# Patient Record
Sex: Female | Born: 1995 | Race: White | Hispanic: No | Marital: Single | Smoking: Never smoker
Health system: Southern US, Community
[De-identification: ages and names within clinical notes are randomized; demographics above are authoritative.]

## PROBLEM LIST (undated history)

## (undated) DIAGNOSIS — K219 Gastro-esophageal reflux disease without esophagitis: Secondary | ICD-10-CM

## (undated) DIAGNOSIS — E66811 Obesity, class 1: Secondary | ICD-10-CM

## (undated) DIAGNOSIS — T7840XA Allergy, unspecified, initial encounter: Secondary | ICD-10-CM

## (undated) DIAGNOSIS — E669 Obesity, unspecified: Secondary | ICD-10-CM

## (undated) HISTORY — DX: Obesity, class 1: E66.811

## (undated) HISTORY — DX: Obesity, unspecified: E66.9

## (undated) HISTORY — DX: Allergy, unspecified, initial encounter: T78.40XA

---
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
## 2014-09-07 ENCOUNTER — Encounter: Payer: Self-pay | Admitting: General Surgery

## 2014-09-07 ENCOUNTER — Ambulatory Visit (INDEPENDENT_AMBULATORY_CARE_PROVIDER_SITE_OTHER): Payer: PRIVATE HEALTH INSURANCE | Admitting: General Surgery

## 2014-09-07 VITALS — BP 100/70 | HR 64 | Resp 12 | Ht 64.0 in | Wt 165.0 lb

## 2014-09-07 DIAGNOSIS — K602 Anal fissure, unspecified: Secondary | ICD-10-CM | POA: Diagnosis not present

## 2014-09-07 NOTE — Progress Notes (Signed)
Patient ID: Katelyn SportsmanEmory S Hall, female   DOB: 11/26/1995, 18 y.o.   MRN: [id number]  Chief Complaint  Patient presents with  . Other    hemorrhoidal skin tag with anal bleeding    HPI Katelyn Hall is a 18 y.o. female who presents for an evaluation of a hemorrhoidal skin tag. She states she has some rectal bleeding with wiping. She noticed this area approximately 2 months ago. She states since she has noticed this area it has become uncomfortable.    HPI  Past Medical History  Diagnosis Date  . Allergy     History reviewed. No pertinent past surgical history.  History reviewed. No pertinent family history.  Social History History  Substance Use Topics  . Smoking status: Never Smoker   . Smokeless tobacco: Never Used  . Alcohol Use: Yes    Not on File  Current Outpatient Prescriptions  Medication Sig Dispense Refill  . cetirizine (ZYRTEC) 10 MG tablet Take 1 tablet by mouth daily.  11  . fluticasone (FLONASE) 50 MCG/ACT nasal spray Place 1 spray into both nostrils daily.    . SPRINTEC 28 0.25-35 MG-MCG tablet Take 1 tablet by mouth daily.  4   No current facility-administered medications for this visit.    Review of Systems Review of Systems  Constitutional: Negative.   Respiratory: Negative.   Cardiovascular: Negative.     Blood pressure 100/70, pulse 64, resp. rate 12, height 5\' 4"  (1.626 m), weight 165 lb (74.844 kg), last menstrual period 09/06/2014.  Physical Exam Physical Exam  Constitutional: She is oriented to person, place, and time. She appears well-developed and well-nourished.  Eyes: Conjunctivae are normal.  Neck: Neck supple. No thyromegaly present.  Cardiovascular: Normal rate, regular rhythm and normal heart sounds.   No murmur heard. Pulmonary/Chest: Effort normal and breath sounds normal.  Abdominal: Soft. Bowel sounds are normal. There is no tenderness. No hernia.  Genitourinary: Rectal exam shows fissure (5mm long posterior anal fissure with a  small sentinel tag).  Neurological: She is alert and oriented to person, place, and time.  Skin: Skin is warm.    Data Reviewed    Assessment    Posterior anal fissure     Plan    Pt is a [employer]ing [employer]. Best option is lateral internal sphincterotomy. Explained all options and risks and benefits. Pt is agreeable to surgical treatment..     Patient to call back to schedule surgery once she has insurance information for January. She has been given surgery paperwork and prospective surgery dates available.    [name] G 09/07/2014, 7:45 PM

## 2014-09-07 NOTE — Patient Instructions (Addendum)
The patient is aware to call back for any questions or concerns.  Patient to call back to schedule surgery once she has insurance information for January. She has been given surgery paperwork and prospective surgery dates available.

## 2016-09-17 DIAGNOSIS — Z8619 Personal history of other infectious and parasitic diseases: Secondary | ICD-10-CM

## 2016-09-17 HISTORY — DX: Personal history of other infectious and parasitic diseases: Z86.19

## 2017-06-06 ENCOUNTER — Encounter: Payer: Self-pay | Admitting: General Surgery

## 2017-06-06 ENCOUNTER — Ambulatory Visit (INDEPENDENT_AMBULATORY_CARE_PROVIDER_SITE_OTHER): Payer: 59 | Admitting: General Surgery

## 2017-06-06 VITALS — BP 120/82 | HR 101 | Resp 12 | Ht 62.0 in | Wt 191.0 lb

## 2017-06-06 DIAGNOSIS — K602 Anal fissure, unspecified: Secondary | ICD-10-CM

## 2017-06-06 NOTE — Progress Notes (Signed)
Patient ID: Katelyn Hall, female   DOB: 04/03/1996, 21 y.o.   MRN: [id number]  Chief Complaint  Patient presents with  . Rectal Bleeding    HPI Katelyn Hall is a 21 y.o. female here today for rectal bleeding, worsening for the past 2 weeks. Reports bright red mucous bleeding with bowel movements and urination when she wipes. She has irregular bowel movements and goes every 1-3 days but last night had 3 BMs. She has abdominal pain that is relieved with bowel movements and reports sudden urgency to go.  Patient had episodes of rectal bleeding in 2017 and was seen here and diagnosed with an anal fissure, but did not return to clinic for follow up. Denies shortness of breath, lightheadedness, nausea, vomiting. Negative family history for autoimmune conditions. Father was diagnosed with colon cancer at age 61, doing well now. Paternal uncles have history of colon polyps. Mother is present with patient in clinic.   HPI  Past Medical History:  Diagnosis Date  . Allergy     No past surgical history on file.  Family History  Problem Relation Age of Onset  . Colon cancer Father 80  . Colon polyps Paternal Uncle     Social History Social History  Substance Use Topics  . Smoking status: Never Smoker  . Smokeless tobacco: Never Used  . Alcohol use Yes    No Known Allergies  Current Outpatient Prescriptions  Medication Sig Dispense Refill  . cetirizine (ZYRTEC) 10 MG tablet Take 1 tablet by mouth daily.  11  . fluticasone (FLONASE) 50 MCG/ACT nasal spray Place 1 spray into both nostrils daily.    . SPRINTEC 28 0.25-35 MG-MCG tablet Take 1 tablet by mouth daily.  4   No current facility-administered medications for this visit.     Review of Systems Review of Systems  Constitutional: Negative.   Respiratory: Negative.   Cardiovascular: Negative.   Gastrointestinal: Positive for abdominal pain and blood in stool. Negative for diarrhea, nausea and vomiting.    Blood pressure  120/82, pulse (!) 101, resp. rate 12, height  (1.575 m), weight 191 lb (86.6 kg), last menstrual period 05/06/2017.  Physical Exam Physical Exam  Constitutional: She is oriented to person, place, and time. She appears well-developed and well-nourished.  Cardiovascular: Normal rate and regular rhythm.   Pulmonary/Chest: Effort normal and breath sounds normal.  Abdominal: Soft. Bowel sounds are normal. She exhibits no mass. There is no tenderness.  Genitourinary:     Neurological: She is alert and oriented to person, place, and time.  Skin: Skin is warm and dry.  Psychiatric: She has a normal mood and affect. Her behavior is normal.    Data Reviewed Prior notes reviewed   Assessment    Anal fissure - patient had a documented anal fissure in 2017 and did not return to the clinic for the procedure. Physical exam today shows anal fissure is still present. Her rectal symptoms are likely from this but full exam of anorectal area can be accomplished at time of surgery    Plan   Discussed anal sphincterotomy/fissurectomy as before and at the same time, do a sigmoidoscopy to ensure no other source for bleeding.    HPI, Physical Exam, Assessment and Plan have been scribed under the direction and in the presence of Kathreen Cosier, MD  Ples Specter, CMA  I have completed the exam and reviewed the above documentation for accuracy and completeness.  I agree with the above.  Nurse, children's  Technology has been used and any errors in dictation or transcription are unintentional.  Hashir Deleeuw G. Irvan Tiedt, M.D., F.A.C.S.   Cariana Karge G 06/06/2017, 2:26 PM  Patient's surgery has been scheduled for 06-13-17 at [facility].   Michele J. Bailey, CMA    

## 2017-06-06 NOTE — Patient Instructions (Signed)
Discussed anal sphincterotomy/fissurectomy as before and at the same time, do a sigmoidoscopy to ensure no other source for bleeding.

## 2017-06-10 ENCOUNTER — Encounter
Admission: RE | Admit: 2017-06-10 | Discharge: 2017-06-10 | Disposition: A | Discharge status: Home / Self Care | Payer: 59 | Source: Ambulatory Visit | Attending: General Surgery | Admitting: General Surgery

## 2017-06-10 HISTORY — DX: Gastro-esophageal reflux disease without esophagitis: K21.9

## 2017-06-10 NOTE — Patient Instructions (Signed)
Your procedure is scheduled on: 06-13-17 Report to Same Day Surgery 2nd floor medical mall [facility] Entrance-take elevator on left to 2nd floor.  Check in with surgery information desk.) To find out your arrival time please call [phone] between 1PM - 3PM on 06-12-17  Remember: Instructions that are not followed completely may result in serious medical risk, up to and including death, or upon the discretion of your surgeon and anesthesiologist your surgery may need to be rescheduled.    _x___ 1. Do not eat food after midnight the night before your procedure. You may drink clear liquids up to 2 hours before you are scheduled to arrive at the hospital for your procedure.  Do not drink clear liquids within 2 hours of your scheduled arrival to the hospital.  Clear liquids include  --Water or Apple juice without pulp  --Clear carbohydrate beverage such as ClearFast or Gatorade  --Black Coffee or Clear Tea (No milk, no creamers, do not add anything to the coffee or Tea Type 1 and type 2 diabetics should only drink water.  No gum chewing or hard candies.     __x__ 2. No Alcohol for 24 hours before or after surgery.   __x__3. No Smoking for 24 prior to surgery.   ____  4. Bring all medications with you on the day of surgery if instructed.    __x__ 5. Notify your doctor if there is any change in your medical condition     (cold, fever, infections).     Do not wear jewelry, make-up, hairpins, clips or nail polish.  Do not wear lotions, powders, or perfumes. You may wear deodorant.  Do not shave 48 hours prior to surgery. Men may shave face and neck.  Do not bring valuables to the hospital.    [facility] is not responsible for any belongings or valuables.               Contacts, dentures or bridgework may not be worn into surgery.  Leave your suitcase in the car. After surgery it may be brought to your room.  For patients admitted to the hospital, discharge time is determined by your                        treatment team.   Patients discharged the day of surgery will not be allowed to drive home.  You will need someone to drive you home and stay with you the night of your procedure.    Please read over the following fact sheets that you were given:     ____ Take anti-hypertensive listed below, cardiac, seizure, asthma,     anti-reflux and psychiatric medicines. These include:  1. NONE  2.  3.  4.  5.  6.  _X___Fleets enema or Magnesium Citrate as directed-DO FLEET ENEMA AT HOME Thursday MORNING ONE HOUR PRIOR TO ARRIVAL TIME TO HOSPITAL   ____ Use CHG Soap or sage wipes as directed on instruction sheet   ____ Use inhalers on the day of surgery and bring to hospital day of surgery  ____ Stop Metformin and Janumet 2 days prior to surgery.    ____ Take 1/2 of usual insulin dose the night before surgery and none on the morning surgery.   ____ Follow recommendations from Cardiologist, Pulmonologist or PCP regarding stopping Aspirin, Coumadin, Plavix ,Eliquis, Effient, or Pradaxa, and Pletal.  ____Stop Anti-inflammatories such as Advil, Aleve, Ibuprofen, Motrin, Naproxen, Naprosyn, Goodies powders or aspirin products.  OK to take Tylenol    ____ Stop supplements until after surgery.    ____ Bring C-Pap to the hospital.

## 2017-06-13 ENCOUNTER — Encounter
Admission: RE | Disposition: A | Discharge status: Home / Self Care | Payer: Self-pay | Source: Ambulatory Visit | Attending: General Surgery

## 2017-06-13 ENCOUNTER — Ambulatory Visit
Admission: RE | Admit: 2017-06-13 | Discharge: 2017-06-13 | Disposition: A | Discharge status: Home / Self Care | Payer: 59 | Source: Ambulatory Visit | Attending: General Surgery | Admitting: General Surgery

## 2017-06-13 ENCOUNTER — Ambulatory Visit: Payer: 59 | Admitting: Registered Nurse

## 2017-06-13 DIAGNOSIS — K219 Gastro-esophageal reflux disease without esophagitis: Secondary | ICD-10-CM | POA: Diagnosis not present

## 2017-06-13 DIAGNOSIS — Z8371 Family history of colonic polyps: Secondary | ICD-10-CM | POA: Diagnosis not present

## 2017-06-13 DIAGNOSIS — K602 Anal fissure, unspecified: Secondary | ICD-10-CM | POA: Diagnosis not present

## 2017-06-13 DIAGNOSIS — K625 Hemorrhage of anus and rectum: Secondary | ICD-10-CM | POA: Diagnosis not present

## 2017-06-13 DIAGNOSIS — K644 Residual hemorrhoidal skin tags: Secondary | ICD-10-CM | POA: Insufficient documentation

## 2017-06-13 DIAGNOSIS — Z800 Family history of malignant neoplasm of digestive organs: Secondary | ICD-10-CM | POA: Insufficient documentation

## 2017-06-13 DIAGNOSIS — Z79899 Other long term (current) drug therapy: Secondary | ICD-10-CM | POA: Diagnosis not present

## 2017-06-13 HISTORY — PX: SIGMOIDOSCOPY: SHX6686

## 2017-06-13 HISTORY — PX: SPHINCTEROTOMY: SHX5279

## 2017-06-13 LAB — POCT PREGNANCY, URINE: Preg Test, Ur: NEGATIVE

## 2017-06-13 SURGERY — SPHINCTEROTOMY, ANAL
Anesthesia: General | Wound class: Clean Contaminated

## 2017-06-13 MED ORDER — PROPOFOL 10 MG/ML IV BOLUS
INTRAVENOUS | Status: AC
  Filled 2017-06-13: qty 20

## 2017-06-13 MED ORDER — FENTANYL CITRATE (PF) 100 MCG/2ML IJ SOLN: 25.0000 ug | INTRAMUSCULAR | Status: DC | PRN

## 2017-06-13 MED ORDER — PROMETHAZINE HCL 25 MG/ML IJ SOLN: 6.2500 mg | INTRAMUSCULAR | Status: DC | PRN

## 2017-06-13 MED ORDER — MIDAZOLAM HCL 2 MG/2ML IJ SOLN
INTRAMUSCULAR | Status: AC
  Filled 2017-06-13: qty 2

## 2017-06-13 MED ORDER — DEXAMETHASONE SODIUM PHOSPHATE 10 MG/ML IJ SOLN
INTRAMUSCULAR | Status: DC | PRN
  Administered 2017-06-13: 5 mg via INTRAVENOUS

## 2017-06-13 MED ORDER — LIDOCAINE HCL (PF) 2 % IJ SOLN
INTRAMUSCULAR | Status: AC
  Filled 2017-06-13: qty 2

## 2017-06-13 MED ORDER — SEVOFLURANE IN SOLN
RESPIRATORY_TRACT | Status: AC
  Filled 2017-06-13: qty 250

## 2017-06-13 MED ORDER — LIDOCAINE HCL 2 % EX GEL
CUTANEOUS | Status: AC
  Filled 2017-06-13: qty 5

## 2017-06-13 MED ORDER — TRAMADOL HCL 50 MG PO TABS: 50.0000 mg | ORAL_TABLET | Freq: Four times a day (QID) | ORAL | 0 refills | Status: DC | PRN

## 2017-06-13 MED ORDER — BUPIVACAINE HCL (PF) 0.5 % IJ SOLN
INTRAMUSCULAR | Status: DC | PRN
  Administered 2017-06-13: 11 mL

## 2017-06-13 MED ORDER — BACIT-POLY-NEO HC 1 % EX OINT
TOPICAL_OINTMENT | CUTANEOUS | Status: DC | PRN
  Administered 2017-06-13: 1

## 2017-06-13 MED ORDER — DEXAMETHASONE SODIUM PHOSPHATE 10 MG/ML IJ SOLN
INTRAMUSCULAR | Status: AC
  Filled 2017-06-13: qty 1

## 2017-06-13 MED ORDER — LACTATED RINGERS IV SOLN
INTRAVENOUS | Status: DC
  Administered 2017-06-13: 09:00:00 via INTRAVENOUS

## 2017-06-13 MED ORDER — LIDOCAINE HCL (PF) 1 % IJ SOLN
INTRAMUSCULAR | Status: DC | PRN
  Administered 2017-06-13: 11 mL

## 2017-06-13 MED ORDER — BACITRACIN ZINC 500 UNIT/GM EX OINT
TOPICAL_OINTMENT | CUTANEOUS | Status: AC
  Filled 2017-06-13: qty 28.35

## 2017-06-13 MED ORDER — FLEET ENEMA 7-19 GM/118ML RE ENEM: 1.0000 | ENEMA | Freq: Once | RECTAL | Status: DC

## 2017-06-13 MED ORDER — LIDOCAINE HCL (PF) 1 % IJ SOLN
INTRAMUSCULAR | Status: AC
  Filled 2017-06-13: qty 2

## 2017-06-13 MED ORDER — MIDAZOLAM HCL 2 MG/2ML IJ SOLN
INTRAMUSCULAR | Status: DC | PRN
  Administered 2017-06-13: 2 mg via INTRAVENOUS

## 2017-06-13 MED ORDER — FAMOTIDINE 20 MG PO TABS
ORAL_TABLET | ORAL | Status: AC
  Administered 2017-06-13: 20 mg via ORAL
  Filled 2017-06-13: qty 1

## 2017-06-13 MED ORDER — PROPOFOL 10 MG/ML IV BOLUS
INTRAVENOUS | Status: DC | PRN
  Administered 2017-06-13: 200 mg via INTRAVENOUS

## 2017-06-13 MED ORDER — OXYCODONE HCL 5 MG PO TABS
5.0000 mg | ORAL_TABLET | Freq: Once | ORAL | Status: AC | PRN
  Administered 2017-06-13: 5 mg via ORAL

## 2017-06-13 MED ORDER — FAMOTIDINE 20 MG PO TABS
20.0000 mg | ORAL_TABLET | Freq: Once | ORAL | Status: AC
  Administered 2017-06-13: 20 mg via ORAL

## 2017-06-13 MED ORDER — ONDANSETRON HCL 4 MG/2ML IJ SOLN
INTRAMUSCULAR | Status: AC
  Filled 2017-06-13: qty 2

## 2017-06-13 MED ORDER — LIDOCAINE HCL (CARDIAC) 20 MG/ML IV SOLN
INTRAVENOUS | Status: DC | PRN
  Administered 2017-06-13: 60 mg via INTRAVENOUS

## 2017-06-13 MED ORDER — LIDOCAINE HCL (PF) 1 % IJ SOLN
INTRAMUSCULAR | Status: AC
  Filled 2017-06-13: qty 30

## 2017-06-13 MED ORDER — FENTANYL CITRATE (PF) 100 MCG/2ML IJ SOLN
INTRAMUSCULAR | Status: DC | PRN
  Administered 2017-06-13 (×4): 25 ug via INTRAVENOUS

## 2017-06-13 MED ORDER — OXYCODONE HCL 5 MG/5ML PO SOLN
5.0000 mg | Freq: Once | ORAL | Status: AC | PRN
Start: 2017-06-13 — End: 2017-06-13

## 2017-06-13 MED ORDER — OXYCODONE HCL 5 MG PO TABS
ORAL_TABLET | ORAL | Status: AC
  Filled 2017-06-13: qty 1

## 2017-06-13 MED ORDER — ONDANSETRON HCL 4 MG/2ML IJ SOLN
INTRAMUSCULAR | Status: DC | PRN
  Administered 2017-06-13: 4 mg via INTRAVENOUS

## 2017-06-13 MED ORDER — ACETAMINOPHEN 10 MG/ML IV SOLN
INTRAVENOUS | Status: AC
  Filled 2017-06-13: qty 100

## 2017-06-13 MED ORDER — FENTANYL CITRATE (PF) 100 MCG/2ML IJ SOLN
INTRAMUSCULAR | Status: AC
  Filled 2017-06-13: qty 2

## 2017-06-13 MED ORDER — BUPIVACAINE HCL (PF) 0.5 % IJ SOLN
INTRAMUSCULAR | Status: AC
  Filled 2017-06-13: qty 30

## 2017-06-13 SURGICAL SUPPLY — 24 items
BLADE SURG 11 STRL SS SAFETY (MISCELLANEOUS) ×3 IMPLANT
BLADE SURG 15 STRL SS SAFETY (BLADE) ×6 IMPLANT
BRIEF STRETCH MATERNITY 2XLG (MISCELLANEOUS) ×3 IMPLANT
CANISTER SUCT 1200ML W/VALVE (MISCELLANEOUS) ×3 IMPLANT
DRAPE LAPAROTOMY 77X122 PED (DRAPES) ×3 IMPLANT
DRAPE LEGGINS SURG 28X43 STRL (DRAPES) ×3 IMPLANT
DRAPE UNDER BUTTOCK W/FLU (DRAPES) ×3 IMPLANT
ELECT REM PT RETURN 9FT ADLT (ELECTROSURGICAL) ×3
ELECTRODE REM PT RTRN 9FT ADLT (ELECTROSURGICAL) ×1 IMPLANT
GLOVE BIO SURGEON STRL SZ7 (GLOVE) ×15 IMPLANT
GOWN STRL REUS W/ TWL LRG LVL3 (GOWN DISPOSABLE) ×3 IMPLANT
GOWN STRL REUS W/TWL LRG LVL3 (GOWN DISPOSABLE) ×6
KIT RM TURNOVER CYSTO AR (KITS) ×3 IMPLANT
LABEL OR SOLS (LABEL) ×3 IMPLANT
NEEDLE HYPO 25X1 1.5 SAFETY (NEEDLE) ×3 IMPLANT
PACK BASIN MINOR ARMC (MISCELLANEOUS) ×3 IMPLANT
PAD OB MATERNITY 4.3X12.25 (PERSONAL CARE ITEMS) ×3 IMPLANT
PAD PREP 24X41 OB/GYN DISP (PERSONAL CARE ITEMS) ×3 IMPLANT
SOL PREP PVP 2OZ (MISCELLANEOUS) ×3
SOLUTION PREP PVP 2OZ (MISCELLANEOUS) ×1 IMPLANT
SURGILUBE 2OZ TUBE FLIPTOP (MISCELLANEOUS) ×3 IMPLANT
SUT VIC AB 3-0 SH 27 (SUTURE) ×2
SUT VIC AB 3-0 SH 27X BRD (SUTURE) ×1 IMPLANT
SYR CONTROL 10ML (SYRINGE) ×3 IMPLANT

## 2017-06-13 NOTE — Interval H&P Note (Signed)
History and Physical Interval Note:  06/13/2017 9:44 AM  Katelyn Hall  has presented today for surgery, with the diagnosis of ANAL FISSURE,RECTAL BLEEDING  The various methods of treatment have been discussed with the patient and family. After consideration of risks, benefits and other options for treatment, the patient has consented to  Procedure(s): SPHINCTEROTOMY (N/A) FISSURECTOMY (N/A) SIGMOIDOSCOPY (N/A) as a surgical intervention .  The patient's history has been reviewed, patient examined, no change in status, stable for surgery.  I have reviewed the patient's chart and labs.  Questions were answered to the patient's satisfaction.     SANKAR,SEEPLAPUTHUR G

## 2017-06-13 NOTE — Anesthesia Postprocedure Evaluation (Signed)
Anesthesia Post Note  Patient: Yaileen S Cena  Procedure(s) Performed: Procedure(s) (LRB): LATERAL INTERNAL SPHINCTEROTOMY (N/A) SIGMOIDOSCOPY (N/A)  Patient location during evaluation: PACU Anesthesia Type: General Level of consciousness: awake and alert Pain management: pain level controlled Vital Signs Assessment: post-procedure vital signs reviewed and stable Respiratory status: spontaneous breathing, nonlabored ventilation, respiratory function stable and patient connected to nasal cannula oxygen Cardiovascular status: blood pressure returned to baseline and stable Postop Assessment: no apparent nausea or vomiting Anesthetic complications: no     Last Vitals:  Vitals:   06/13/17 1140 06/13/17 1147  BP: 124/83 111/69  Pulse: 67 68  Resp: (!) 21 16  Temp:  (!) 35.6 C  SpO2: 100% 100%    Last Pain:  Vitals:   06/13/17 1147  TempSrc: Temporal  PainSc: 8                  Jomarie Longs K Piscitello

## 2017-06-13 NOTE — Anesthesia Procedure Notes (Signed)
Procedure Name: LMA Insertion Date/Time: 06/13/2017 10:00 AM Performed by: Karoline Caldwell Pre-anesthesia Checklist: Patient identified, Patient being monitored, Timeout performed, Emergency Drugs available and Suction available Patient Re-evaluated:Patient Re-evaluated prior to induction Oxygen Delivery Method: Circle system utilized Preoxygenation: Pre-oxygenation with 100% oxygen Induction Type: IV induction Ventilation: Mask ventilation without difficulty LMA: LMA inserted LMA Size: 4.0 Tube type: Oral Number of attempts: 1 Placement Confirmation: positive ETCO2 and breath sounds checked- equal and bilateral Tube secured with: Tape Dental Injury: Teeth and Oropharynx as per pre-operative assessment

## 2017-06-13 NOTE — Anesthesia Preprocedure Evaluation (Signed)
Anesthesia Evaluation  Patient identified by MRN, date of birth, ID band Patient awake    Reviewed: Allergy & Precautions, H&P , NPO status , Patient's Chart, lab work & pertinent test results  History of Anesthesia Complications Negative for: history of anesthetic complications  Airway Mallampati: III  TM Distance: <3 FB Neck ROM: full    Dental  (+) Chipped   Pulmonary neg pulmonary ROS, neg shortness of breath,           Cardiovascular Exercise Tolerance: Good (-) angina(-) Past MI and (-) DOE negative cardio ROS       Neuro/Psych negative neurological ROS  negative psych ROS   GI/Hepatic Neg liver ROS, GERD  Controlled and Medicated,  Endo/Other  negative endocrine ROS  Renal/GU      Musculoskeletal   Abdominal   Peds  Hematology negative hematology ROS (+)   Anesthesia Other Findings Past Medical History: No date: Allergy No date: GERD (gastroesophageal reflux disease)     Comment:  OCC-TUMS PRN  History reviewed. No pertinent surgical history.  BMI    Body Mass Index:  34.93 kg/m      Reproductive/Obstetrics negative OB ROS                             Anesthesia Physical Anesthesia Plan  ASA: III  Anesthesia Plan: General LMA   Post-op Pain Management:    Induction: Intravenous  PONV Risk Score and Plan: 3 and Ondansetron, Dexamethasone, Midazolam and Treatment may vary due to age or medical condition  Airway Management Planned: LMA  Additional Equipment:   Intra-op Plan:   Post-operative Plan: Extubation in OR  Informed Consent: I have reviewed the patients History and Physical, chart, labs and discussed the procedure including the risks, benefits and alternatives for the proposed anesthesia with the patient or authorized representative who has indicated his/her understanding and acceptance.   Dental Advisory Given  Plan Discussed with: Anesthesiologist,  CRNA and Surgeon  Anesthesia Plan Comments: (Patient consented for risks of anesthesia including but not limited to:  - adverse reactions to medications - damage to teeth, lips or other oral mucosa - sore throat or hoarseness - Damage to heart, brain, lungs or loss of life  Patient voiced understanding.)        Anesthesia Quick Evaluation

## 2017-06-13 NOTE — Op Note (Signed)
Preop diagnosis: Anal fissure  Post op diagnosis: Same  Operation: Rigid sigmoidoscopy and lateral internal sphincterotomy  Surgeon: S PEG. Renold Kozar  Assistant:     Anesthesia: Gen.  Complications: None  EBL: Minimal  Drains: None  Description: Patient was put to sleep and then placed in the lithotomy position the operating table. Rectal area was prepped and draped sterile field and timeout performed. Rigid sigmoidoscope was inserted and advanced about the 20 cm level. The scope was gradually withdrawn inspecting the lumen with no abnormality noted. After the scope was removed the anal area was smaller adequately visualize showing that there was a well-defined chronic posterior anal fissure with a tiny 3 mm skin tag. A bivalve speculum was then used and lateral internal sphincterotomy completed at the 3 and 9:00 locations. Total of 20 mL of 0.5% Marcaine mixed with 1% Xylocaine was instilled at the side of sphincterotomy and the anal fissure. 2 small radial incisions were made at the 3 and 9:00 location. The edge of the internal sphincter was lifted up and cut with cautery. 2 openings were closed with buried stitches of 3-0 Vicryl. Anal fissure area was cauterized to get rid of any unhealthy tissue. The anorectal area was covered with bacitracin ointment. Patient subsequently taken down from lithotomy and returned subsequently to recovery room in stable condition.

## 2017-06-13 NOTE — Progress Notes (Signed)
States pain is somewhat better and wants to go home

## 2017-06-13 NOTE — Anesthesia Post-op Follow-up Note (Signed)
Anesthesia QCDR form completed.        

## 2017-06-13 NOTE — Transfer of Care (Signed)
Immediate Anesthesia Transfer of Care Note  Patient: Katelyn Hall  Procedure(s) Performed: Procedure(s): LATERAL INTERNAL SPHINCTEROTOMY (N/A) SIGMOIDOSCOPY (N/A)  Patient Location: PACU  Anesthesia Type:General  Level of Consciousness: awake, alert  and oriented  Airway & Oxygen Therapy: Patient Spontanous Breathing and Patient connected to face mask oxygen  Post-op Assessment: Report given to RN and Post -op Vital signs reviewed and stable  Post vital signs: Reviewed and stable  Last Vitals:  Vitals:   06/13/17 0828 06/13/17 1101  BP: 140/87   Pulse: 74 62  Resp: 17 18  Temp: (!) 35.9 C (!) 36.4 C  SpO2: 99% 100%    Last Pain:  Vitals:   06/13/17 1101  TempSrc: Temporal         Complications: No apparent anesthesia complications

## 2017-06-13 NOTE — H&P (View-Only) (Signed)
Patient ID: Katelyn Hall, female   DOB: 04/03/1996, 21 y.o.   MRN: [id number]  Chief Complaint  Patient presents with  . Rectal Bleeding    HPI Katelyn Hall is a 21 y.o. female here today for rectal bleeding, worsening for the past 2 weeks. Reports bright red mucous bleeding with bowel movements and urination when she wipes. She has irregular bowel movements and goes every 1-3 days but last night had 3 BMs. She has abdominal pain that is relieved with bowel movements and reports sudden urgency to go.  Patient had episodes of rectal bleeding in 2017 and was seen here and diagnosed with an anal fissure, but did not return to clinic for follow up. Denies shortness of breath, lightheadedness, nausea, vomiting. Negative family history for autoimmune conditions. Father was diagnosed with colon cancer at age 61, doing well now. Paternal uncles have history of colon polyps. Mother is present with patient in clinic.   HPI  Past Medical History:  Diagnosis Date  . Allergy     No past surgical history on file.  Family History  Problem Relation Age of Onset  . Colon cancer Father 80  . Colon polyps Paternal Uncle     Social History Social History  Substance Use Topics  . Smoking status: Never Smoker  . Smokeless tobacco: Never Used  . Alcohol use Yes    No Known Allergies  Current Outpatient Prescriptions  Medication Sig Dispense Refill  . cetirizine (ZYRTEC) 10 MG tablet Take 1 tablet by mouth daily.  11  . fluticasone (FLONASE) 50 MCG/ACT nasal spray Place 1 spray into both nostrils daily.    . SPRINTEC 28 0.25-35 MG-MCG tablet Take 1 tablet by mouth daily.  4   No current facility-administered medications for this visit.     Review of Systems Review of Systems  Constitutional: Negative.   Respiratory: Negative.   Cardiovascular: Negative.   Gastrointestinal: Positive for abdominal pain and blood in stool. Negative for diarrhea, nausea and vomiting.    Blood pressure  120/82, pulse (!) 101, resp. rate 12, height  (1.575 m), weight 191 lb (86.6 kg), last menstrual period 05/06/2017.  Physical Exam Physical Exam  Constitutional: She is oriented to person, place, and time. She appears well-developed and well-nourished.  Cardiovascular: Normal rate and regular rhythm.   Pulmonary/Chest: Effort normal and breath sounds normal.  Abdominal: Soft. Bowel sounds are normal. She exhibits no mass. There is no tenderness.  Genitourinary:     Neurological: She is alert and oriented to person, place, and time.  Skin: Skin is warm and dry.  Psychiatric: She has a normal mood and affect. Her behavior is normal.    Data Reviewed Prior notes reviewed   Assessment    Anal fissure - patient had a documented anal fissure in 2017 and did not return to the clinic for the procedure. Physical exam today shows anal fissure is still present. Her rectal symptoms are likely from this but full exam of anorectal area can be accomplished at time of surgery    Plan   Discussed anal sphincterotomy/fissurectomy as before and at the same time, do a sigmoidoscopy to ensure no other source for bleeding.    HPI, Physical Exam, Assessment and Plan have been scribed under the direction and in the presence of Katelyn Cosier, MD  Katelyn Hall, CMA  I have completed the exam and reviewed the above documentation for accuracy and completeness.  I agree with the above.  Nurse, children's  Technology has been used and any errors in dictation or transcription are unintentional.  Katelyn Hall, M.D., F.A.C.S.   Katelyn Hall 06/06/2017, 2:26 PM  Patient's surgery has been scheduled for 06-13-17 at [facility].   Katelyn Hall, CMA

## 2017-06-14 ENCOUNTER — Encounter: Payer: Self-pay | Admitting: General Surgery

## 2017-06-17 DIAGNOSIS — R87619 Unspecified abnormal cytological findings in specimens from cervix uteri: Secondary | ICD-10-CM

## 2017-06-17 HISTORY — DX: Unspecified abnormal cytological findings in specimens from cervix uteri: R87.619

## 2017-06-20 ENCOUNTER — Ambulatory Visit: Payer: 59 | Admitting: General Surgery

## 2017-06-24 ENCOUNTER — Ambulatory Visit (INDEPENDENT_AMBULATORY_CARE_PROVIDER_SITE_OTHER): Payer: 59 | Admitting: General Surgery

## 2017-06-24 ENCOUNTER — Encounter: Payer: Self-pay | Admitting: General Surgery

## 2017-06-24 VITALS — BP 120/70 | HR 103 | Resp 12 | Ht 64.0 in | Wt 190.0 lb

## 2017-06-24 DIAGNOSIS — K602 Anal fissure, unspecified: Secondary | ICD-10-CM

## 2017-06-24 NOTE — Patient Instructions (Addendum)
Return in 1 month for follow up. Return earlier if needed

## 2017-06-24 NOTE — Progress Notes (Signed)
Patient ID: Katelyn Hall, female   DOB: April 30, 1996, 21 y.o.   MRN: [id number]  No chief complaint on file.   HPI Katelyn Hall is a 21 y.o. female here today for her post op sphincterotomy and sigmoidoscopy done on 06/13/2017. Patient states she is doing well. She only has mild itching at the site. Bowels are normal.   HPI  Past Medical History:  Diagnosis Date  . Allergy   . GERD (gastroesophageal reflux disease)    OCC-TUMS PRN    Past Surgical History:  Procedure Laterality Date  . SIGMOIDOSCOPY N/A 06/13/2017   Procedure: SIGMOIDOSCOPY;  Surgeon: Kieth Brightly, MD;  Location: [facility];  Service: General;  Laterality: N/A;  . SPHINCTEROTOMY N/A 06/13/2017   Procedure: LATERAL INTERNAL SPHINCTEROTOMY;  Surgeon: Kieth Brightly, MD;  Location: [facility];  Service: General;  Laterality: N/A;    Family History  Problem Relation Age of Onset  . Colon cancer Father 65  . Colon polyps Paternal Uncle     Social History Social History  Substance Use Topics  . Smoking status: Never Smoker  . Smokeless tobacco: Never Used  . Alcohol use Yes     Comment: OCC    No Known Allergies  Current Outpatient Prescriptions  Medication Sig Dispense Refill  . acetaminophen (TYLENOL) 325 MG tablet Take 650 mg by mouth every 6 (six) hours as needed for mild pain or headache.    . calcium carbonate (TUMS - DOSED IN MG ELEMENTAL CALCIUM) 500 MG chewable tablet Chew 1 tablet by mouth as needed for indigestion or heartburn.    . fluticasone (FLONASE) 50 MCG/ACT nasal spray Place 1 spray into both nostrils daily as needed for allergies or rhinitis.     . naproxen sodium (ANAPROX) 220 MG tablet Take 440-660 mg by mouth daily as needed (headache or pain). Depends on pain level if takes 2-3 tablets    . SPRINTEC 28 0.25-35 MG-MCG tablet Take 1 tablet by mouth daily.  4   No current facility-administered medications for this visit.     Review of Systems Review of Systems   Constitutional: Negative.   Respiratory: Negative.   Cardiovascular: Negative.     Blood pressure 120/70, pulse (!) 103, resp. rate 12, height  (1.626 m), weight 190 lb (86.2 kg).  Physical Exam Physical Exam  Constitutional: She is oriented to person, place, and time. She appears well-developed and well-nourished.  Abdominal: Soft. There is no tenderness.  Sphincterotomy site is healing well  Neurological: She is alert and oriented to person, place, and time.  Skin: Skin is warm and dry.  Psychiatric: She has a normal mood and affect. Her behavior is normal.    Data Reviewed Prior notes reviewed  Assessment    Anal fissure s/p lateral internal sphincterotomy and sigmoidoscopy 06/13/17 - healing well. Bowel movements are normal and bleeding symptoms have resolved.  Patient's father had colon cancer at age 75 and did not receive genetic testing. She should receive a colonoscopy at age 9.      Plan    Apply topical cream to relieve itching if necessary. Return to clinic in 1 month for follow up. Counseled patient on monitoring for any concerning abdominal symptoms and should receive a colonoscopy at age 67 given father's history of colon cancer at age 66.   HPI, Physical Exam, Assessment and Plan have been scribed under the direction and in the presence of Kathreen Cosier, MD  Ples Specter, CMA  I have completed the exam and reviewed the above documentation for accuracy and completeness.  I agree with the above.  Museum/gallery conservator has been used and any errors in dictation or transcription are unintentional.  Seeplaputhur G. Evette Cristal, M.D., F.A.C.S.   Gerlene Burdock G 06/24/2017, 10:26 AM

## 2017-06-25 DIAGNOSIS — R875 Abnormal microbiological findings in specimens from female genital organs: Secondary | ICD-10-CM | POA: Diagnosis not present

## 2017-06-25 DIAGNOSIS — R87612 Low grade squamous intraepithelial lesion on cytologic smear of cervix (LGSIL): Secondary | ICD-10-CM | POA: Diagnosis not present

## 2017-06-25 DIAGNOSIS — Z01419 Encounter for gynecological examination (general) (routine) without abnormal findings: Secondary | ICD-10-CM | POA: Diagnosis not present

## 2017-07-25 ENCOUNTER — Telehealth: Payer: Self-pay

## 2017-07-25 NOTE — Telephone Encounter (Signed)
Patient came into the office this morning complaining of burning and pain in the rectal area. She had surgery to repair an anal fissure a month ago. She reports in the past few days she has had bleeding off and on and now has swelling in the rectal are that she thinks is a hemorrhoid. She is having a lot of trouble with sitting down.  I spoke with Dr Evette CristalSankar about this and he will see the patient in office tomorrow morning at 9:00 am. In the meantime she may make use of Anusol cream rectally to minimize her symptoms.

## 2017-07-26 ENCOUNTER — Encounter: Payer: Self-pay | Admitting: General Surgery

## 2017-07-26 ENCOUNTER — Ambulatory Visit (INDEPENDENT_AMBULATORY_CARE_PROVIDER_SITE_OTHER): Payer: 59 | Admitting: General Surgery

## 2017-07-26 VITALS — BP 122/78 | HR 78 | Resp 12 | Ht 64.0 in | Wt 192.0 lb

## 2017-07-26 DIAGNOSIS — L02215 Cutaneous abscess of perineum: Secondary | ICD-10-CM

## 2017-07-26 MED ORDER — AMOXICILLIN-POT CLAVULANATE 875-125 MG PO TABS: 1.0000 | ORAL_TABLET | Freq: Two times a day (BID) | ORAL | 0 refills | Status: AC

## 2017-07-26 NOTE — Progress Notes (Signed)
Patient ID: Katelyn SportsmanEmory S Barkdull, female   DOB: 06/10/1996, 21 y.o.   MRN: [id number]  Chief Complaint  Patient presents with  . Routine Post Op    anal fissurectomy    HPI Katelyn Hall Knifeurner is a 21 y.o. female here today for her follow up anal fissurectomy done on 06/13/2017. Patient states she is started having rectal bleeding and pain that began two weeks ago. Admits some yellow drainage. Denies chills and fever.  HPI  Past Medical History:  Diagnosis Date  . Allergy   . GERD (gastroesophageal reflux disease)    OCC-TUMS PRN   Past Surgical History:  SIGMOIDOSCOPY N/A 06/13/2017   Procedure: SIGMOIDOSCOPY;  Surgeon: Kieth BrightlySankar, Kayda Allers G, MD;  Location: [facility];  Service: General;  Laterality: N/A;  . SPHINCTEROTOMY N/A 06/13/2017   Procedure: LATERAL INTERNAL SPHINCTEROTOMY;  Surgeon: Kieth BrightlySankar, Render Marley G, MD;  Location: [facility];  Service: General;  Laterality: N/A;     Family History  Problem Relation Age of Onset  . Colon cancer Father 4438  . Colon polyps Paternal Uncle     Social History Social History   Tobacco Use  . Smoking status: Never Smoker  . Smokeless tobacco: Never Used  Substance Use Topics  . Alcohol use: Yes    Comment: OCC  . Drug use: No    No Known Allergies  Current Outpatient Medications  Medication Sig Dispense Refill  . acetaminophen (TYLENOL) 325 MG tablet Take 650 mg by mouth every 6 (six) hours as needed for mild pain or headache.    . calcium carbonate (TUMS - DOSED IN MG ELEMENTAL CALCIUM) 500 MG chewable tablet Chew 1 tablet by mouth as needed for indigestion or heartburn.    . fluticasone (FLONASE) 50 MCG/ACT nasal spray Place 1 spray into both nostrils daily as needed for allergies or rhinitis.     . naproxen sodium (ANAPROX) 220 MG tablet Take 440-660 mg by mouth daily as needed (headache or pain). Depends on pain level if takes 2-3 tablets    . SPRINTEC 28 0.25-35 MG-MCG tablet Take 1 tablet by mouth daily.  4  .  amoxicillin-clavulanate (AUGMENTIN) 875-125 MG tablet Take 1 tablet 2 (two) times daily for 7 days by mouth. 14 tablet 0   No current facility-administered medications for this visit.     Review of Systems Review of Systems  Constitutional: Negative.   Respiratory: Negative.   Cardiovascular: Negative.   Gastrointestinal: Positive for anal bleeding and rectal pain.    Blood pressure 122/78, pulse 78, resp. rate 12, height 5\' 4"  (1.626 m), weight 192 lb (87.1 kg), last menstrual period 07/15/2017.  Physical Exam Physical Exam  Constitutional: She is oriented to person, place, and time. She appears well-developed and well-nourished.  Abdominal: Soft. Normal appearance.  Genitourinary: Rectal exam shows no external hemorrhoid, no internal hemorrhoid and no fissure.  Genitourinary Comments: No anal fissure appreciated. Significant tenderness with palpation of the left rectal area. Possible mild induration. No drainage appreciated.   Neurological: She is alert and oriented to person, place, and time.  Skin: Skin is warm and dry.  Psychiatric: She has a normal mood and affect. Her behavior is normal.    Data Reviewed Prior notes, op note  Assessment    Left sided rectal pain, likely perirectal abscess. No evidence of fissure or hemorrhoids. No signs of systemic infection.  Discussed trial of antibiotics vs. Drainage. Will attempt one week of antibiotics- Augmentin. If no improvement in 2-3 days or worsening of symptoms  will proceed to drainage.     Plan    Patient to return in two weeks for re-evaluation. Augmentin for 1 week. Can sit in warm bath for pain relief.  Instructed pt to call if no improvement in 2-3 days or worsening. The patient is aware to call back for any questions or concerns.      HPI, Physical Exam, Assessment and Plan have been scribed under the direction and in the presence of Kathreen CosierS. G. Emrik Erhard, MD  Ples SpecterJessica Qualls, CMA  I have completed the exam and reviewed the  above documentation for accuracy and completeness.  I agree with the above.  Museum/gallery conservatorDragon Technology has been used and any errors in dictation or transcription are unintentional.  Safiya Girdler G. Evette CristalSankar, M.D., F.A.C.S.  Gerlene BurdockSANKAR,Camala Talwar G 07/26/2017, 6:30 PM

## 2017-07-26 NOTE — Patient Instructions (Addendum)
  Patient to return in two weeks. The patient is aware to call back for any questions or concerns.    

## 2017-08-05 ENCOUNTER — Encounter: Payer: Self-pay | Admitting: General Surgery

## 2017-08-05 ENCOUNTER — Ambulatory Visit (INDEPENDENT_AMBULATORY_CARE_PROVIDER_SITE_OTHER): Payer: 59 | Admitting: General Surgery

## 2017-08-05 VITALS — BP 118/72 | HR 83 | Resp 14 | Ht 62.0 in | Wt 191.0 lb

## 2017-08-05 DIAGNOSIS — L02215 Cutaneous abscess of perineum: Secondary | ICD-10-CM

## 2017-08-05 DIAGNOSIS — K602 Anal fissure, unspecified: Secondary | ICD-10-CM

## 2017-08-05 NOTE — Progress Notes (Signed)
Patient ID: Katelyn Hall, female   DOB: 08/27/1996, 21 y.o.   MRN: [id number]  Chief Complaint  Patient presents with  . Follow-up    HPI Katelyn Hall is a 21 y.o. female here today for her follow up anal fissurectomy done on 06/13/2017 and subsequent perianal abscess 5 weeks later. Completed one week antibiotics. Patient states she is doing well with a significant improvement in her pain. Admits some drainage from the area that was bloody and white. She has been using the bathroom without difficulty.  HPI  Past Medical History:  Diagnosis Date  . Allergy   . GERD (gastroesophageal reflux disease)    OCC-TUMS PRN    Past Surgical History:  Procedure Laterality Date  . LATERAL INTERNAL SPHINCTEROTOMY N/A 06/13/2017   [facility]formed [facility] Katelyn Hall[facility]  . SIGMOIDOSCOPY N/A 06/13/2017   [facility]ately[facility] Hall[facility]amble [facility]    Family History  Problem Relation Age of Onset  . Colon cancer Father 7638  . Colon polyps Paternal Uncle     Social History Social History   Tobacco Use  . Smoking status: Never Smoker  . Smokeless tobacco: Never Used  Substance Use Topics  . Alcohol use: Yes    Comment: OCC  . Drug use: No    No Known Allergies  Current Outpatient Medications  Medication Sig Dispense Refill  . acetaminophen (TYLENOL) 325 MG tablet Take 650 mg by mouth every 6 (six) hours as needed for mild pain or headache.    . calcium carbonate (TUMS - DOSED IN MG ELEMENTAL CALCIUM) 500 MG chewable tablet Chew 1 tablet by mouth as needed for indigestion or heartburn.    . fluticasone (FLONASE) 50 MCG/ACT nasal spray Place 1 spray into both nostrils daily as needed for allergies or rhinitis.     . naproxen sodium (ANAPROX) 220 MG tablet Take 440-660 mg by mouth daily as needed (headache or pain). Depends on pain level if takes 2-3 tablets    . SPRINTEC 28 0.25-35 MG-MCG tablet Take 1 tablet by mouth daily.  4   No current facility-administered  medications for this visit.     Review of Systems Review of Systems  Constitutional: Negative.   Respiratory: Negative.   Cardiovascular: Negative.   Gastrointestinal: Negative.     Blood pressure 118/72, pulse 83, resp. rate 14, height 5\' 2"  (1.575 m), weight 191 lb (86.6 kg), last menstrual period 07/15/2017.  Physical Exam Physical Exam  Constitutional: She is oriented to person, place, and time. She appears well-developed and well-nourished.  Genitourinary: Rectal exam shows no external hemorrhoid, no internal hemorrhoid and no fissure.  Genitourinary Comments: Fissure well healed. No drainage, induration, abscess, bleeding appreciated.    Neurological: She is alert and oriented to person, place, and time.    Data Reviewed Prior notes, op note  Assessment    S/p lateral internal sphincterotomy/ fissurectomy 06/13/17. Perianal abscess 5 weeks later. Improved symptoms and exam. Fissure and incision sites well healed. Exam improved. No evidence of abscess. Good response to antibiotics.     Plan  Follow up as needed. Drink plenty of water and maintain high fiber diet.     HPI, Physical Exam, Assessment and Plan have been scribed under the direction and in the presence of Katelyn Hall. Hall. Katelyn Sedam, MD  Katelyn Hall, CMA   I have completed the exam and reviewed the above documentation for accuracy and completeness.  I agree with the above.  Nurse, children'sDragon  Technology has been used and any errors in dictation or transcription are unintentional.  Katelyn Hall Hall. Evette CristalSankar, M.D., F.A.C.S.  Katelyn Hall 08/05/2017, 12:21 PM

## 2017-08-05 NOTE — Patient Instructions (Signed)
Follow up as needed. Drink plenty of water and maintain high fiber diet.

## 2017-09-02 DIAGNOSIS — Z713 Dietary counseling and surveillance: Secondary | ICD-10-CM | POA: Diagnosis not present

## 2017-09-02 DIAGNOSIS — Z0000 Encounter for general adult medical examination without abnormal findings: Secondary | ICD-10-CM | POA: Diagnosis not present

## 2017-09-19 DIAGNOSIS — Z0000 Encounter for general adult medical examination without abnormal findings: Secondary | ICD-10-CM | POA: Diagnosis not present

## 2017-09-19 DIAGNOSIS — J029 Acute pharyngitis, unspecified: Secondary | ICD-10-CM | POA: Diagnosis not present

## 2017-09-19 DIAGNOSIS — K120 Recurrent oral aphthae: Secondary | ICD-10-CM | POA: Diagnosis not present

## 2017-10-04 DIAGNOSIS — Z23 Encounter for immunization: Secondary | ICD-10-CM | POA: Diagnosis not present

## 2017-10-16 DIAGNOSIS — K219 Gastro-esophageal reflux disease without esophagitis: Secondary | ICD-10-CM | POA: Diagnosis not present

## 2017-10-16 DIAGNOSIS — J3501 Chronic tonsillitis: Secondary | ICD-10-CM | POA: Diagnosis not present

## 2017-10-16 DIAGNOSIS — R070 Pain in throat: Secondary | ICD-10-CM | POA: Diagnosis not present

## 2017-10-16 DIAGNOSIS — J309 Allergic rhinitis, unspecified: Secondary | ICD-10-CM | POA: Diagnosis not present

## 2017-10-17 DIAGNOSIS — Z111 Encounter for screening for respiratory tuberculosis: Secondary | ICD-10-CM | POA: Diagnosis not present

## 2017-10-17 DIAGNOSIS — J301 Allergic rhinitis due to pollen: Secondary | ICD-10-CM | POA: Diagnosis not present

## 2017-10-23 DIAGNOSIS — Z111 Encounter for screening for respiratory tuberculosis: Secondary | ICD-10-CM | POA: Diagnosis not present

## 2017-11-06 DIAGNOSIS — J3503 Chronic tonsillitis and adenoiditis: Secondary | ICD-10-CM | POA: Diagnosis not present

## 2017-11-06 DIAGNOSIS — J309 Allergic rhinitis, unspecified: Secondary | ICD-10-CM | POA: Diagnosis not present

## 2017-11-22 DIAGNOSIS — J301 Allergic rhinitis due to pollen: Secondary | ICD-10-CM | POA: Diagnosis not present

## 2017-11-25 DIAGNOSIS — J301 Allergic rhinitis due to pollen: Secondary | ICD-10-CM | POA: Diagnosis not present

## 2017-11-28 DIAGNOSIS — J301 Allergic rhinitis due to pollen: Secondary | ICD-10-CM | POA: Diagnosis not present

## 2017-12-02 DIAGNOSIS — J301 Allergic rhinitis due to pollen: Secondary | ICD-10-CM | POA: Diagnosis not present

## 2017-12-06 DIAGNOSIS — J301 Allergic rhinitis due to pollen: Secondary | ICD-10-CM | POA: Diagnosis not present

## 2017-12-09 DIAGNOSIS — J301 Allergic rhinitis due to pollen: Secondary | ICD-10-CM | POA: Diagnosis not present

## 2017-12-12 DIAGNOSIS — J301 Allergic rhinitis due to pollen: Secondary | ICD-10-CM | POA: Diagnosis not present

## 2017-12-16 DIAGNOSIS — J301 Allergic rhinitis due to pollen: Secondary | ICD-10-CM | POA: Diagnosis not present

## 2017-12-19 DIAGNOSIS — J301 Allergic rhinitis due to pollen: Secondary | ICD-10-CM | POA: Diagnosis not present

## 2017-12-23 DIAGNOSIS — J301 Allergic rhinitis due to pollen: Secondary | ICD-10-CM | POA: Diagnosis not present

## 2017-12-26 DIAGNOSIS — J301 Allergic rhinitis due to pollen: Secondary | ICD-10-CM | POA: Diagnosis not present

## 2017-12-30 DIAGNOSIS — J301 Allergic rhinitis due to pollen: Secondary | ICD-10-CM | POA: Diagnosis not present

## 2017-12-31 DIAGNOSIS — J301 Allergic rhinitis due to pollen: Secondary | ICD-10-CM | POA: Diagnosis not present

## 2018-01-02 DIAGNOSIS — J301 Allergic rhinitis due to pollen: Secondary | ICD-10-CM | POA: Diagnosis not present

## 2018-01-06 DIAGNOSIS — J301 Allergic rhinitis due to pollen: Secondary | ICD-10-CM | POA: Diagnosis not present

## 2018-01-09 DIAGNOSIS — J301 Allergic rhinitis due to pollen: Secondary | ICD-10-CM | POA: Diagnosis not present

## 2018-01-13 DIAGNOSIS — J301 Allergic rhinitis due to pollen: Secondary | ICD-10-CM | POA: Diagnosis not present

## 2018-01-20 DIAGNOSIS — J301 Allergic rhinitis due to pollen: Secondary | ICD-10-CM | POA: Diagnosis not present

## 2018-01-21 DIAGNOSIS — J301 Allergic rhinitis due to pollen: Secondary | ICD-10-CM | POA: Diagnosis not present

## 2018-01-23 DIAGNOSIS — J029 Acute pharyngitis, unspecified: Secondary | ICD-10-CM | POA: Diagnosis not present

## 2018-02-03 DIAGNOSIS — J301 Allergic rhinitis due to pollen: Secondary | ICD-10-CM | POA: Diagnosis not present

## 2018-09-02 DIAGNOSIS — Z23 Encounter for immunization: Secondary | ICD-10-CM | POA: Diagnosis not present

## 2019-12-08 ENCOUNTER — Other Ambulatory Visit: Payer: Self-pay

## 2019-12-08 ENCOUNTER — Ambulatory Visit: Payer: BC Managed Care – PPO | Admitting: Nurse Practitioner

## 2019-12-08 ENCOUNTER — Encounter: Payer: Self-pay | Admitting: Nurse Practitioner

## 2019-12-08 VITALS — BP 120/80 | HR 87 | Temp 97.3°F | Ht 64.0 in | Wt 194.8 lb

## 2019-12-08 DIAGNOSIS — Z6833 Body mass index (BMI) 33.0-33.9, adult: Secondary | ICD-10-CM | POA: Diagnosis not present

## 2019-12-08 DIAGNOSIS — Z8742 Personal history of other diseases of the female genital tract: Secondary | ICD-10-CM

## 2019-12-08 DIAGNOSIS — Z800 Family history of malignant neoplasm of digestive organs: Secondary | ICD-10-CM

## 2019-12-08 DIAGNOSIS — E669 Obesity, unspecified: Secondary | ICD-10-CM | POA: Diagnosis not present

## 2019-12-08 DIAGNOSIS — Z0000 Encounter for general adult medical examination without abnormal findings: Secondary | ICD-10-CM

## 2019-12-08 DIAGNOSIS — R7989 Other specified abnormal findings of blood chemistry: Secondary | ICD-10-CM | POA: Diagnosis not present

## 2019-12-08 NOTE — Progress Notes (Signed)
New Patient Office Visit  Subjective:  Patient ID: Katelyn Hall, female    DOB: 13-May-1996  Age: 24 y.o. MRN: [id number]  CC:  Chief Complaint  Patient presents with  . Establish Care    physical     HPI Katelyn Hall presents to establish care. She has no allergy or heartburn complaints. She is working on weight loss.   Her father had colon cancer at age 51. She is unsure if he had genetic testing. Without a FH of Lynch syndrome, she would be due for her first colonoscopy at age 59.   She is on birth control pill. LMP 11/19/2019. She is not currently sexually active for the last year. She reports abnormal PAP "or something" in the past. 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 She had chlamydia on PAP test in 2018 and treated with Azithromycin and diflucan.   Training as Naval architect school. Applying to jobs and hopes to work at [employer]. Dentist: last month Eye exam: 2 years ago Eating a low carb, healthy diet and is losing weight. 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 Working out at [employer] regularly. Weight loss is a goal.  PHQ-2=0  Past Medical History:  Diagnosis Date  . Abnormal Pap smear of cervix 06/2017  . Allergy   . FH: colon cancer in relative <72 years old 12/09/2019  . GERD (gastroesophageal reflux disease)    OCC-TUMS PRN  . History of chlamydia 2018   Found by PAP smear and Rx for azithromycin and diflucan  . Obesity (BMI 30.0-34.9)     Past Surgical History:  Procedure Laterality Date  . SIGMOIDOSCOPY N/A 06/13/2017   Procedure: SIGMOIDOSCOPY;  Surgeon: Katelyn Lye, MD;  Location: [facility];  Service: General;  Laterality: N/A;  . SPHINCTEROTOMY N/A 06/13/2017   Procedure: LATERAL INTERNAL SPHINCTEROTOMY;  Surgeon: Katelyn Lye, MD;  Location: [facility];  Service: General;  Laterality: N/A;    Family History  Problem Relation Age of Onset  . Colon cancer Father 19  . Colon polyps Paternal  Uncle   . Cancer Paternal Grandmother     Social History   Socioeconomic History  . Marital status: Single    Spouse name: Not on file  . Number of children: Not on file  . Years of education: Not on file  . Highest education level: Not on file  Occupational History  . Not on file  Tobacco Use  . Smoking status: Never Smoker  . Smokeless tobacco: Never Used  Substance and Sexual Activity  . Alcohol use: Yes    Alcohol/week: 6.0 standard drinks    Types: 6 Cans of beer per week    Comment: weekends   . Drug use: No  . Sexual activity: Not Currently  Other Topics Concern  . Not on file  Social History Narrative  . Not on file   Social Determinants of Health   Financial Resource Strain:   . Difficulty of Paying Living Expenses:   Food Insecurity:   . Worried About Charity fundraiser in the Last Year:   . Arboriculturist in the Last Year:   Transportation Needs:   . Film/video editor (Medical):   Marland Kitchen Lack of Transportation (Non-Medical):   Physical Activity:   . Days of Exercise per Week:   . Minutes of Exercise per Session:   Stress:   . Feeling of Stress :  Social Connections:   . Frequency of Communication with Friends and Family:   . Frequency of Social Gatherings with Friends and Family:   . Attends Religious Services:   . Active Member of Clubs or Organizations:   . Attends Archivist Meetings:   Marland Kitchen Marital Status:   Intimate Partner Violence:   . Fear of Current or Ex-Partner:   . Emotionally Abused:   Marland Kitchen Physically Abused:   . Sexually Abused:     Review of Systems Complete ROS is reviewed and pertinent positives noted in HPI otherwise negative.    Objective:   Today's Vitals: BP 120/80   Pulse 87   Temp (!) 97.3 F (36.3 C) (Temporal)   Ht 5' 4"  (1.626 m)   Wt 194 lb 12.8 oz (88.4 kg)   LMP 11/19/2019   SpO2 99%   BMI 33.44 kg/m   Physical Exam Vitals reviewed.  Constitutional:      Appearance: Normal appearance.  HENT:       Head: Atraumatic.     Comments: Wearing mask secondary to COVID pandemic. Eyes:     General: No scleral icterus.    Conjunctiva/sclera: Conjunctivae normal.     Pupils: Pupils are equal, round, and reactive to light.  Cardiovascular:     Rate and Rhythm: Regular rhythm.     Heart sounds: No murmur. No gallop.   Pulmonary:     Effort: Pulmonary effort is normal.     Breath sounds: Normal breath sounds.  Abdominal:     General: Bowel sounds are normal.     Palpations: Abdomen is soft.     Tenderness: There is no abdominal tenderness.  Musculoskeletal:        General: No swelling or tenderness. Normal range of motion.     Cervical back: Normal range of motion and neck supple.  Skin:    General: Skin is warm and dry.     Findings: No rash.  Neurological:     General: No focal deficit present.     Mental Status: He is alert and oriented to person, place, and time.  Psychiatric:        Mood and Affect: Mood normal.        Behavior: Behavior normal.    Assessment & Plan:   Problem List Items Addressed This Visit      Other   BMI 33.0-33.9,adult   Relevant Orders   HgB A1c   History of abnormal cervical Pap smear   FH: colon cancer in relative <88 years old    Other Visit Diagnoses    Preventative health care    -  Primary   Relevant Orders   CBC with Differential/Platelet   Comp Met (CMET)   TSH   Lipid Profile   Urinalysis   Ambulatory referral to Obstetrics / Gynecology     Please check with your father about genetic testing for colon cancer.   You will need your first colonoscopy 10 years before his cancer was found.   Please go to the lab today.   I placed a GYN referral with Katelyn Hall and you should get a call from their office. Let me know if you do not.   Follow up in one year or sooner if you have any concerns. Take care and keep exercising!   Outpatient Encounter Medications as of 12/08/2019  Medication Sig  . acetaminophen (TYLENOL) 325  MG tablet Take 650 mg by mouth every 6 (six) hours as needed  for mild pain or headache.  . calcium carbonate (TUMS - DOSED IN MG ELEMENTAL CALCIUM) 500 MG chewable tablet Chew 1 tablet by mouth as needed for indigestion or heartburn.  . fluticasone (FLONASE) 50 MCG/ACT nasal spray Place 1 spray into both nostrils daily as needed for allergies or rhinitis.   . naproxen sodium (ANAPROX) 220 MG tablet Take 440-660 mg by mouth daily as needed (headache or pain). Depends on pain level if takes 2-3 tablets  . SPRINTEC 28 0.25-35 MG-MCG tablet Take 1 tablet by mouth daily.   No facility-administered encounter medications on file as of 12/08/2019.   Follow-up: Return in about 1 year (around 12/07/2020).  This visit occurred during the SARS-CoV-2 public health emergency.  Safety protocols were in place, including screening questions prior to the visit, additional usage of staff PPE, and extensive cleaning of exam room while observing appropriate contact time as indicated for disinfecting solutions.   Denice Paradise, NP

## 2019-12-08 NOTE — Patient Instructions (Addendum)
It was a pleasure to meet you today.  Please check with your father about genetic testing for colon cancer.   You will need your first colonoscopy 10 years before his cancer was found.   Please go to the lab today.   I placed a GYN referral with Dr. Rubie Maid and you should get a call from their office. Let me know if you do not.   Follow up in one year or sooner if you have any concerns. Take care and keep exercising!     Preventive Care 44-25 Years Old Old, Female Preventive care refers to visits with your health care provider and lifestyle choices that can promote health and wellness. This includes:  A yearly physical exam. This may also be called an annual well check.  Regular dental visits and eye exams.  Immunizations.  Screening for certain conditions.  Healthy lifestyle choices, such as eating a healthy diet, getting regular exercise, not using drugs or products that contain nicotine and tobacco, and limiting alcohol use. What can I expect for my preventive care visit? Physical exam Your health care provider will check your:  Height and weight. This may be used to calculate body mass index (BMI), which tells if you are at a healthy weight.  Heart rate and blood pressure.  Skin for abnormal spots. Counseling Your health care provider may ask you questions about your:  Alcohol, tobacco, and drug use.  Emotional well-being.  Home and relationship well-being.  Sexual activity.  Eating habits.  Work and work Statistician.  Method of birth control.  Menstrual cycle.  Pregnancy history. What immunizations do I need?  Influenza (flu) vaccine  This is recommended every year. Tetanus, diphtheria, and pertussis (Tdap) vaccine  You may need a Td booster every 10 years. Varicella (chickenpox) vaccine  You may need this if you have not been vaccinated. Human papillomavirus (HPV) vaccine  If recommended by your health care provider, you may need three doses  over 6 months. Measles, mumps, and rubella (MMR) vaccine  You may need at least one dose of MMR. You may also need a second dose. Meningococcal conjugate (MenACWY) vaccine  One dose is recommended if you are age 32-21 years and a first-year college student living in a residence hall, or if you have one of several medical conditions. You may also need additional booster doses. Pneumococcal conjugate (PCV13) vaccine  You may need this if you have certain conditions and were not previously vaccinated. Pneumococcal polysaccharide (PPSV23) vaccine  You may need one or two doses if you smoke cigarettes or if you have certain conditions. Hepatitis A vaccine  You may need this if you have certain conditions or if you travel or work in places where you may be exposed to hepatitis A. Hepatitis B vaccine  You may need this if you have certain conditions or if you travel or work in places where you may be exposed to hepatitis B. Haemophilus influenzae type b (Hib) vaccine  You may need this if you have certain conditions. You may receive vaccines as individual doses or as more than one vaccine together in one shot (combination vaccines). Talk with your health care provider about the risks and benefits of combination vaccines. What tests do I need?  Blood tests  Lipid and cholesterol levels. These may be checked every 5 years starting at age 59.  Hepatitis C test.  Hepatitis B test. Screening  Diabetes screening. This is done by checking your blood sugar (glucose) after you have  not eaten for a while (fasting).  Sexually transmitted disease (STD) testing.  BRCA-related cancer screening. This may be done if you have a family history of breast, ovarian, tubal, or peritoneal cancers.  Pelvic exam and Pap test. This may be done every 3 years starting at age 10. Starting at age 48, this may be done every 5 years if you have a Pap test in combination with an HPV test. Talk with your health care  provider about your test results, treatment options, and if necessary, the need for more tests. Follow these instructions at home: Eating and drinking   Eat a diet that includes fresh fruits and vegetables, whole grains, lean protein, and low-fat dairy.  Take vitamin and mineral supplements as recommended by your health care provider.  Do not drink alcohol if: ? Your health care provider tells you not to drink. ? You are pregnant, may be pregnant, or are planning to become pregnant.  If you drink alcohol: ? Limit how much you have to 0-1 drink a day. ? Be aware of how much alcohol is in your drink. In the [place] one drink equals one 12 oz bottle of beer (355 mL), one 5 oz glass of wine (148 mL), or one 1 oz glass of hard liquor (44 mL). Lifestyle  Take daily care of your teeth and gums.  Stay active. Exercise for at least 30 minutes on 5 or more days each week.  Do not use any products that contain nicotine or tobacco, such as cigarettes, e-cigarettes, and chewing tobacco. If you need help quitting, ask your health care provider.  If you are sexually active, practice safe sex. Use a condom or other form of birth control (contraception) in order to prevent pregnancy and STIs (sexually transmitted infections). If you plan to become pregnant, see your health care provider for a preconception visit. What's next?  Visit your health care provider once a year for a well check visit.  Ask your health care provider how often you should have your eyes and teeth checked.  Stay up to date on all vaccines. This information is not intended to replace advice given to you by your health care provider. Make sure you discuss any questions you have with your health care provider. Document Revised: 05/15/2018 Document Reviewed: 05/15/2018 Elsevier Patient Education  2020 [employer].

## 2019-12-09 ENCOUNTER — Encounter: Payer: Self-pay | Admitting: Nurse Practitioner

## 2019-12-09 DIAGNOSIS — Z8742 Personal history of other diseases of the female genital tract: Secondary | ICD-10-CM | POA: Insufficient documentation

## 2019-12-09 DIAGNOSIS — Z6833 Body mass index (BMI) 33.0-33.9, adult: Secondary | ICD-10-CM | POA: Insufficient documentation

## 2019-12-09 DIAGNOSIS — Z800 Family history of malignant neoplasm of digestive organs: Secondary | ICD-10-CM | POA: Insufficient documentation

## 2019-12-09 HISTORY — DX: Family history of malignant neoplasm of digestive organs: Z80.0

## 2019-12-09 LAB — URINALYSIS
Bilirubin Urine: NEGATIVE
Hgb urine dipstick: NEGATIVE
Ketones, ur: NEGATIVE
Leukocytes,Ua: NEGATIVE
Nitrite: NEGATIVE
Specific Gravity, Urine: 1.02 (ref 1.000–1.030)
Total Protein, Urine: NEGATIVE
Urine Glucose: NEGATIVE
Urobilinogen, UA: 0.2 (ref 0.0–1.0)
pH: 7 (ref 5.0–8.0)

## 2019-12-09 LAB — CBC WITH DIFFERENTIAL/PLATELET
Basophils Absolute: 0.1 10*3/uL (ref 0.0–0.1)
Basophils Relative: 0.7 % (ref 0.0–3.0)
Eosinophils Absolute: 0.1 10*3/uL (ref 0.0–0.7)
Eosinophils Relative: 1.5 % (ref 0.0–5.0)
HCT: 38 % (ref 36.0–46.0)
Hemoglobin: 12.9 g/dL (ref 12.0–15.0)
Lymphocytes Relative: 31.7 % (ref 12.0–46.0)
Lymphs Abs: 2.5 10*3/uL (ref 0.7–4.0)
MCHC: 33.8 g/dL (ref 30.0–36.0)
MCV: 87.9 fl (ref 78.0–100.0)
Monocytes Absolute: 0.4 10*3/uL (ref 0.1–1.0)
Monocytes Relative: 5.5 % (ref 3.0–12.0)
Neutro Abs: 4.8 10*3/uL (ref 1.4–7.7)
Neutrophils Relative %: 60.6 % (ref 43.0–77.0)
Platelets: 231 10*3/uL (ref 150.0–400.0)
RBC: 4.32 Mil/uL (ref 3.87–5.11)
RDW: 13.5 % (ref 11.5–15.5)
WBC: 8 10*3/uL (ref 4.0–10.5)

## 2019-12-09 LAB — COMPREHENSIVE METABOLIC PANEL
ALT: 15 U/L (ref 0–35)
AST: 22 U/L (ref 0–37)
Albumin: 4.1 g/dL (ref 3.5–5.2)
Alkaline Phosphatase: 53 U/L (ref 39–117)
BUN: 11 mg/dL (ref 6–23)
CO2: 26 mEq/L (ref 19–32)
Calcium: 9.2 mg/dL (ref 8.4–10.5)
Chloride: 104 mEq/L (ref 96–112)
Creatinine, Ser: 0.84 mg/dL (ref 0.40–1.20)
GFR: 83.04 mL/min (ref >60.00)
Glucose, Bld: 106 mg/dL — ABNORMAL HIGH (ref 70–99)
Potassium: 3.8 mEq/L (ref 3.5–5.1)
Sodium: 138 mEq/L (ref 135–145)
Total Bilirubin: 0.3 mg/dL (ref 0.2–1.2)
Total Protein: 7.1 g/dL (ref 6.0–8.3)

## 2019-12-09 LAB — LIPID PANEL
Cholesterol: 195 mg/dL (ref 0–200)
HDL: 55.5 mg/dL (ref >39.00)
LDL Cholesterol: 118 mg/dL — ABNORMAL HIGH (ref 0–99)
NonHDL: 139.01
Total CHOL/HDL Ratio: 4
Triglycerides: 104 mg/dL (ref 0.0–149.0)
VLDL: 20.8 mg/dL (ref 0.0–40.0)

## 2019-12-09 LAB — HEMOGLOBIN A1C: Hgb A1c MFr Bld: 5.2 % (ref 4.6–6.5)

## 2019-12-09 LAB — TSH: TSH: 1.52 u[IU]/mL (ref 0.35–4.50)

## 2019-12-15 ENCOUNTER — Encounter: Payer: Self-pay | Admitting: Certified Nurse Midwife

## 2019-12-17 ENCOUNTER — Telehealth: Payer: Self-pay | Admitting: Certified Nurse Midwife

## 2019-12-17 NOTE — Telephone Encounter (Signed)
Pt called in about her appt. The pt was told to book with dr. Valentino Saxon but the pt was put with michelle. The pt was wanting to make sure that was okay. The pt is requesting a call back. Please advise

## 2020-01-18 ENCOUNTER — Telehealth: Payer: Self-pay | Admitting: Nurse Practitioner

## 2020-01-18 MED ORDER — NORGESTIMATE-ETH ESTRADIOL 0.25-35 MG-MCG PO TABS: 1.0000 | ORAL_TABLET | Freq: Every day | ORAL | 4 refills | Status: DC

## 2020-01-18 NOTE — Telephone Encounter (Signed)
Pt is completely out of her birth control Sprintec. Her insurance changed and she had refills with [facility] but since the change she can no longer get her prescription through them. She needs this medicine as soon as possible to be called into [facility].

## 2020-01-29 ENCOUNTER — Encounter: Payer: Self-pay | Admitting: Certified Nurse Midwife

## 2020-02-04 ENCOUNTER — Encounter: Payer: Self-pay | Admitting: Certified Nurse Midwife

## 2020-02-04 ENCOUNTER — Other Ambulatory Visit (HOSPITAL_COMMUNITY)
Admission: RE | Admit: 2020-02-04 | Discharge: 2020-02-04 | Disposition: A | Discharge status: Home / Self Care | Payer: BC Managed Care – PPO | Source: Ambulatory Visit | Attending: Certified Nurse Midwife | Admitting: Certified Nurse Midwife

## 2020-02-04 ENCOUNTER — Ambulatory Visit (INDEPENDENT_AMBULATORY_CARE_PROVIDER_SITE_OTHER): Payer: BC Managed Care – PPO | Admitting: Certified Nurse Midwife

## 2020-02-04 ENCOUNTER — Other Ambulatory Visit: Payer: Self-pay

## 2020-02-04 VITALS — BP 111/75 | HR 99 | Ht 64.0 in | Wt 194.2 lb

## 2020-02-04 DIAGNOSIS — Z3041 Encounter for surveillance of contraceptive pills: Secondary | ICD-10-CM

## 2020-02-04 DIAGNOSIS — Z01419 Encounter for gynecological examination (general) (routine) without abnormal findings: Secondary | ICD-10-CM

## 2020-02-04 DIAGNOSIS — Z113 Encounter for screening for infections with a predominantly sexual mode of transmission: Secondary | ICD-10-CM | POA: Insufficient documentation

## 2020-02-04 DIAGNOSIS — Z124 Encounter for screening for malignant neoplasm of cervix: Secondary | ICD-10-CM

## 2020-02-04 DIAGNOSIS — Z8742 Personal history of other diseases of the female genital tract: Secondary | ICD-10-CM | POA: Diagnosis not present

## 2020-02-04 DIAGNOSIS — Z8619 Personal history of other infectious and parasitic diseases: Secondary | ICD-10-CM | POA: Diagnosis not present

## 2020-02-04 MED ORDER — NORGESTIMATE-ETH ESTRADIOL 0.25-35 MG-MCG PO TABS
1.0000 | ORAL_TABLET | Freq: Every day | ORAL | 4 refills | Status: DC
Start: 2020-02-04 — End: 2021-03-10

## 2020-02-04 NOTE — Progress Notes (Signed)
ANNUAL PREVENTATIVE CARE GYN  ENCOUNTER NOTE  Subjective:       Katelyn Hall is a 24 y.o. G0P0000 female here for a routine annual gynecologic exam.  Current complaints: 1. STI Testing  2. Needs Pap smear, history abnormal 3. Desires Sprintec refill  Doing well. 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   History of abnormal pap, moved to school and did not get follow up as advised.  Denies difficulty breathing or respiratory distress, chest pain, abdominal pain, excessive vaginal bleeding, dysuria, leg pain or swelling  Gynecologic History  Patient's last menstrual period was 01/20/2020 (approximate).  Period Cycle (Days): 28 Period Duration (Days): 3 Period Pattern: Regular Menstrual Flow: Moderate Menstrual Control: Tampon Menstrual Control Change Freq (Hours): 2-3 Dysmenorrhea: (!) Mild Dysmenorrhea Symptoms: Cramping, Diarrhea  Contraception: OCP (estrogen/progesterone)   Last Pap: 06/2017. Results were: abnormal  Obstetric History  OB History  Gravida Para Term Preterm AB Living  0 0 0 0 0 0  SAB TAB Ectopic Multiple Live Births  0 0 0 0    Obstetric Comments  1st Menstrual Cycle:  12  1st Pregnancy:  NA    Past Medical History:  Diagnosis Date  . Abnormal Pap smear of cervix 06/2017  . Allergy   . FH: colon cancer in relative <99 years old 12/09/2019  . GERD (gastroesophageal reflux disease)    OCC-TUMS PRN  . History of chlamydia 2018   Found by PAP smear and Rx for azithromycin and diflucan  . Obesity (BMI 30.0-34.9)     Past Surgical History:  Procedure Laterality Date  . SIGMOIDOSCOPY N/A 06/13/2017   Procedure: SIGMOIDOSCOPY;  Surgeon: Kieth Brightly, MD;  Location: [facility] ORS;  Service: General;  Laterality: N/A;  . SPHINCTEROTOMY N/A 06/13/2017   Procedure: LATERAL INTERNAL SPHINCTEROTOMY;  Surgeon: Kieth Brightly, MD;  Location: [facility] ORS;  Service: General;  Laterality: N/A;     Current Outpatient Medications on File Prior to Visit  Medication Sig Dispense Refill  . norgestimate-ethinyl estradiol (SPRINTEC 28) 0.25-35 MG-MCG tablet Take 1 tablet by mouth daily. 1 Package 4  . acetaminophen (TYLENOL) 325 MG tablet Take 650 mg by mouth every 6 (six) hours as needed for mild pain or headache.    . calcium carbonate (TUMS - DOSED IN MG ELEMENTAL CALCIUM) 500 MG chewable tablet Chew 1 tablet by mouth as needed for indigestion or heartburn.    . fluticasone (FLONASE) 50 MCG/ACT nasal spray Place 1 spray into both nostrils daily as needed for allergies or rhinitis.     . naproxen sodium (ANAPROX) 220 MG tablet Take 440-660 mg by mouth daily as needed (headache or pain). Depends on pain level if takes 2-3 tablets     No current facility-administered medications on file prior to visit.    No Known Allergies  Social History   Socioeconomic History  . Marital status: Single    Spouse name: Not on file  . Number of children: Not on file  . Years of education: Not on file  . Highest education level: Not on file  Occupational History  . Not on file  Tobacco Use  . Smoking status: Never Smoker  . Smokeless tobacco: Never Used  Substance and Sexual Activity  . Alcohol use: Yes    Alcohol/week: 6.0 standard drinks    Types: 6 Cans of beer per week    Comment: weekends   . Drug use: No  . Sexual activity: Yes  Birth control/protection: Pill  Other Topics Concern  . Not on file  Social History Narrative  . Not on file   Social Determinants of Health   Financial Resource Strain:   . Difficulty of Paying Living Expenses:   Food Insecurity:   . Worried About Programme researcher, broadcasting/film/video in the Last Year:   . Barista in the Last Year:   Transportation Needs:   . Freight forwarder (Medical):   Marland Kitchen Lack of Transportation (Non-Medical):   Physical Activity:   . Days of Exercise per Week:   . Minutes of Exercise per Session:   Stress:   . Feeling of  Stress :   Social Connections:   . Frequency of Communication with Friends and Family:   . Frequency of Social Gatherings with Friends and Family:   . Attends Religious Services:   . Active Member of Clubs or Organizations:   . Attends Banker Meetings:   Marland Kitchen Marital Status:   Intimate Partner Violence:   . Fear of Current or Ex-Partner:   . Emotionally Abused:   Marland Kitchen Physically Abused:   . Sexually Abused:     Family History  Problem Relation Age of Onset  . Colon cancer Father 40  . Colon polyps Paternal Uncle   . Cancer Paternal Grandmother     The following portions of the patient's history were reviewed and updated as appropriate: allergies, current medications, past family history, past medical history, past social history, past surgical history and problem list.  Review of Systems  ROS negative except as noted above. Information obtained from patient.   Objective:   BP 111/75   Pulse 99   Ht 5\' 4"  (1.626 m)   Wt 194 lb 3 oz (88.1 kg)   LMP 01/20/2020 (Approximate)   BMI 33.33 kg/m    CONSTITUTIONAL: Well-developed, well-nourished female in no acute distress.   PSYCHIATRIC: Normal mood and affect. Normal behavior. Normal judgment and thought content.  NEUROLGIC: Alert and oriented to person, place, and time. Normal muscle tone coordination. No cranial nerve deficit noted.  HENT:  Normocephalic, atraumatic, External right and left ear normal. Oropharynx is clear and moist  EYES: Conjunctivae and EOM are normal. No scleral icterus.   NECK: Normal range of motion, supple, no masses.  Normal thyroid.   SKIN: Skin is warm and dry. No rash noted. Not diaphoretic. No erythema. No pallor.  CARDIOVASCULAR: Normal heart rate noted, regular rhythm, no murmur.  RESPIRATORY: Clear to auscultation bilaterally. Effort and breath sounds normal, no problems with respiration noted.  BREASTS: Symmetric in size. No masses, skin changes, nipple drainage, or  lymphadenopathy.  ABDOMEN: Soft, normal bowel sounds, no distention noted.  No tenderness, rebound or guarding.   BLADDER: Normal  PELVIC:  External Genitalia: Normal  Vagina: Normal  Cervix: Friable, white thick discharge noted at os.  Uterus: Normal  Adnexa: Normal   MUSCULOSKELETAL: Normal range of motion. No tenderness.  No cyanosis, clubbing, or edema.  2+ distal pulses.  LYMPHATIC: No Axillary, Supraclavicular, or Inguinal Adenopathy.  Assessment:   Annual gynecologic examination 24 y.o.   Contraception: OCP (estrogen/progesterone), Sprintec  Obesity 1   Problem List Items Addressed This Visit      Other   History of abnormal cervical Pap smear   Relevant Orders   Cytology - PAP    Other Visit Diagnoses    Well woman exam    -  Primary   Relevant Orders   Cytology -  PAP   Cervicovaginal ancillary only   RPR   Screening for cervical cancer       Relevant Orders   Cytology - PAP   Routine screening for STI (sexually transmitted infection)       Relevant Orders   Cervicovaginal ancillary only   RPR   History of chlamydia       Positive-06/2017   Relevant Orders   Cervicovaginal ancillary only   Surveillance for birth control, oral contraceptives          Plan:   Pap: Pap, Reflex if ASCUS, GC/CT NAAT, Not needed and Not done  Labs: See orders.  Routine preventative health maintenance measures emphasized: Exercise/Diet/Weight control, Tobacco Warnings, Alcohol/Substance use risks, Stress Management, Peer Pressure Issues and Safe Sex. See AVS.  Rx Sprintec, see orders.   Reviewed red flags and when to call the office.  Return to [place] for [place] exam or sooner if needed.   Fransico Him RN [place] 02/04/20 10:24 AM

## 2020-02-04 NOTE — Patient Instructions (Signed)
Preventive Care 21-25 Years Old, Female Preventive care refers to visits with your health care provider and lifestyle choices that can promote health and wellness. This includes:  A yearly physical exam. This may also be called an annual well check.  Regular dental visits and eye exams.  Immunizations.  Screening for certain conditions.  Healthy lifestyle choices, such as eating a healthy diet, getting regular exercise, not using drugs or products that contain nicotine and tobacco, and limiting alcohol use. What can I expect for my preventive care visit? Physical exam Your health care provider will check your:  Height and weight. This may be used to calculate body mass index (BMI), which tells if you are at a healthy weight.  Heart rate and blood pressure.  Skin for abnormal spots. Counseling Your health care provider may ask you questions about your:  Alcohol, tobacco, and drug use.  Emotional well-being.  Home and relationship well-being.  Sexual activity.  Eating habits.  Work and work environment.  Method of birth control.  Menstrual cycle.  Pregnancy history. What immunizations do I need?  Influenza (flu) vaccine  This is recommended every year. Tetanus, diphtheria, and pertussis (Tdap) vaccine  You may need a Td booster every 10 years. Varicella (chickenpox) vaccine  You may need this if you have not been vaccinated. Human papillomavirus (HPV) vaccine  If recommended by your health care provider, you may need three doses over 6 months. Measles, mumps, and rubella (MMR) vaccine  You may need at least one dose of MMR. You may also need a second dose. Meningococcal conjugate (MenACWY) vaccine  One dose is recommended if you are age 19-21 years and a first-year college student living in a residence hall, or if you have one of several medical conditions. You may also need additional booster doses. Pneumococcal conjugate (PCV13) vaccine  You may need  this if you have certain conditions and were not previously vaccinated. Pneumococcal polysaccharide (PPSV23) vaccine  You may need one or two doses if you smoke cigarettes or if you have certain conditions. Hepatitis A vaccine  You may need this if you have certain conditions or if you travel or work in places where you may be exposed to hepatitis A. Hepatitis B vaccine  You may need this if you have certain conditions or if you travel or work in places where you may be exposed to hepatitis B. Haemophilus influenzae type b (Hib) vaccine  You may need this if you have certain conditions. You may receive vaccines as individual doses or as more than one vaccine together in one shot (combination vaccines). Talk with your health care provider about the risks and benefits of combination vaccines. What tests do I need?  Blood tests  Lipid and cholesterol levels. These may be checked every 5 years starting at age 20.  Hepatitis C test.  Hepatitis B test. Screening  Diabetes screening. This is done by checking your blood sugar (glucose) after you have not eaten for a while (fasting).  Sexually transmitted disease (STD) testing.  BRCA-related cancer screening. This may be done if you have a family history of breast, ovarian, tubal, or peritoneal cancers.  Pelvic exam and Pap test. This may be done every 3 years starting at age 21. Starting at age 30, this may be done every 5 years if you have a Pap test in combination with an HPV test. Talk with your health care provider about your test results, treatment options, and if necessary, the need for more tests.   Follow these instructions at home: Eating and drinking   Eat a diet that includes fresh fruits and vegetables, whole grains, lean protein, and low-fat dairy.  Take vitamin and mineral supplements as recommended by your health care provider.  Do not drink alcohol if: ? Your health care provider tells you not to drink. ? You are  pregnant, may be pregnant, or are planning to become pregnant.  If you drink alcohol: ? Limit how much you have to 0-1 drink a day. ? Be aware of how much alcohol is in your drink. In the [place] one drink equals one 12 oz bottle of beer (355 mL), one 5 oz glass of wine (148 mL), or one 1 oz glass of hard liquor (44 mL). Lifestyle  Take daily care of your teeth and gums.  Stay active. Exercise for at least 30 minutes on 5 or more days each week.  Do not use any products that contain nicotine or tobacco, such as cigarettes, e-cigarettes, and chewing tobacco. If you need help quitting, ask your health care provider.  If you are sexually active, practice safe sex. Use a condom or other form of birth control (contraception) in order to prevent pregnancy and STIs (sexually transmitted infections). If you plan to become pregnant, see your health care provider for a preconception visit. What's next?  Visit your health care provider once a year for a well check visit.  Ask your health care provider how often you should have your eyes and teeth checked.  Stay up to date on all vaccines. This information is not intended to replace advice given to you by your health care provider. Make sure you discuss any questions you have with your health care provider. Document Revised: 05/15/2018 Document Reviewed: 05/15/2018 Elsevier Patient Education  2020 [employer].

## 2020-02-04 NOTE — Progress Notes (Signed)
I have seen, interviewed, and examined the patient in conjunction with the [employer] Nurse Practitioner student and affirm the diagnosis and management plan.   Gunnar Bulla, CNM [facility], [facility] 02/04/20 10:39 AM

## 2020-02-05 ENCOUNTER — Other Ambulatory Visit (INDEPENDENT_AMBULATORY_CARE_PROVIDER_SITE_OTHER): Payer: BC Managed Care – PPO | Admitting: Certified Nurse Midwife

## 2020-02-05 DIAGNOSIS — B3731 Acute candidiasis of vulva and vagina: Secondary | ICD-10-CM

## 2020-02-05 DIAGNOSIS — B373 Candidiasis of vulva and vagina: Secondary | ICD-10-CM

## 2020-02-05 LAB — CERVICOVAGINAL ANCILLARY ONLY
Bacterial Vaginitis (gardnerella): NEGATIVE
Candida Glabrata: NEGATIVE
Candida Vaginitis: POSITIVE — AB
Chlamydia: NEGATIVE
Comment: NEGATIVE
Comment: NEGATIVE
Comment: NEGATIVE
Comment: NEGATIVE
Comment: NEGATIVE
Comment: NORMAL
Neisseria Gonorrhea: NEGATIVE
Trichomonas: NEGATIVE

## 2020-02-05 LAB — RPR: RPR Ser Ql: NONREACTIVE

## 2020-02-05 MED ORDER — FLUCONAZOLE 150 MG PO TABS: 150.0000 mg | ORAL_TABLET | Freq: Once | ORAL | 0 refills | Status: AC

## 2020-02-05 NOTE — Progress Notes (Signed)
Rx: Diflucan, see orders.    Gunnar Bulla, CNM [facility], [facility] 02/05/20 5:55 PM

## 2020-02-09 LAB — CYTOLOGY - PAP: Diagnosis: NEGATIVE

## 2020-05-02 ENCOUNTER — Telehealth: Payer: BC Managed Care – PPO | Admitting: Nurse Practitioner

## 2020-05-02 ENCOUNTER — Other Ambulatory Visit: Payer: Self-pay

## 2020-05-02 ENCOUNTER — Encounter: Payer: Self-pay | Admitting: Nurse Practitioner

## 2020-05-02 ENCOUNTER — Telehealth (INDEPENDENT_AMBULATORY_CARE_PROVIDER_SITE_OTHER): Payer: BC Managed Care – PPO | Admitting: Nurse Practitioner

## 2020-05-02 VITALS — BP 135/92 | Temp 98.8°F | Ht 64.0 in | Wt 200.0 lb

## 2020-05-02 DIAGNOSIS — M549 Dorsalgia, unspecified: Secondary | ICD-10-CM | POA: Diagnosis not present

## 2020-05-02 DIAGNOSIS — Z6834 Body mass index (BMI) 34.0-34.9, adult: Secondary | ICD-10-CM | POA: Diagnosis not present

## 2020-05-02 DIAGNOSIS — R1012 Left upper quadrant pain: Secondary | ICD-10-CM | POA: Diagnosis not present

## 2020-05-02 DIAGNOSIS — R0781 Pleurodynia: Secondary | ICD-10-CM | POA: Diagnosis not present

## 2020-05-02 DIAGNOSIS — R1032 Left lower quadrant pain: Secondary | ICD-10-CM | POA: Insufficient documentation

## 2020-05-02 DIAGNOSIS — R109 Unspecified abdominal pain: Secondary | ICD-10-CM | POA: Diagnosis not present

## 2020-05-02 DIAGNOSIS — R110 Nausea: Secondary | ICD-10-CM | POA: Diagnosis not present

## 2020-05-02 NOTE — Progress Notes (Signed)
Virtual Visit via Virtual  Note  This visit type was conducted due to national recommendations for restrictions regarding the COVID-19 pandemic (e.g. social distancing).  This format is felt to be most appropriate for this patient at this time.  All issues noted in this document were discussed and addressed.  No physical exam was performed (except for noted visual exam findings with Video Visits).   I connected with@ on 05/02/20 at 11:30 AM EDT by a video enabled telemedicine application or telephone and verified that I am speaking with the correct person using two identifiers. Location patient: home Location provider: work or home office Persons participating in the virtual visit: patient, provider  I discussed the limitations, risks, security and privacy concerns of performing an evaluation and management service by telephone and the availability of in person appointments. I also discussed with the patient that there may be a patient responsible charge related to this service. The patient expressed understanding and agreed to proceed.  Interactive audio and video telecommunications were attempted between this provider and patient, however failed, due to patient having technical difficulties OR patient did not have access to video capability.  We continued and completed visit with audio only.   Reason for visit: Severe left flank and abdomen pain rated 9 out of 10.    HPI: This 24 yo had an Urgent Care fast Med  visit today for 1 week history of  left flank/ under rib stabbing pain and was referred to the ED. The Urgent Care note and labs were reviewed. UA and urine preg NEG.   She did not go to the ED. She went home and to bed. She woke up and has more pain. " I thought it would be better, but I woke up and it hurts worse. "  She is tearful.  I advised her to go the ED for evaluation now. She voices understanding.   .  Past Medical History:  Diagnosis Date  . Abnormal Pap smear of cervix  06/2017  . Allergy   . FH: colon cancer in relative <35 years old 12/09/2019  . GERD (gastroesophageal reflux disease)    OCC-TUMS PRN  . History of chlamydia 2018   Found by PAP smear and Rx for azithromycin and diflucan  . Obesity (BMI 30.0-34.9)     Past Surgical History:  Procedure Laterality Date  . SIGMOIDOSCOPY N/A 06/13/2017   Procedure: SIGMOIDOSCOPY;  Surgeon: Kieth Brightly, MD;  Location: [facility] ORS;  Service: General;  Laterality: N/A;  . SPHINCTEROTOMY N/A 06/13/2017   Procedure: LATERAL INTERNAL SPHINCTEROTOMY;  Surgeon: Kieth Brightly, MD;  Location: [facility] ORS;  Service: General;  Laterality: N/A;    Family History  Problem Relation Age of Onset  . Colon cancer Father 80  . Colon polyps Paternal Uncle   . Cancer Paternal Grandmother       Current Outpatient Medications:  .  norgestimate-ethinyl estradiol (SPRINTEC 28) 0.25-35 MG-MCG tablet, Take 1 tablet by mouth daily., Disp: 3 Package, Rfl: 4  EXAM:  VITALS per patient if applicable:  GENERAL: alert, oriented, appears tired and tearful   HEENT: atraumatic, conjunctiva clear, no obvious abnormalities on inspection of external nose and ears  NECK: normal movements of the head and neck  LUNGS: on inspection no signs of respiratory distress, breathing rate appears normal, no obvious gross SOB, gasping or wheezing  CV: no obvious cyanosis  MS: moves all visible extremities without noticeable abnormality  ASSESSMENT AND PLAN:  Discussed the following assessment and  plan:  Left flank pain  No problem-specific Assessment & Plan notes found for this encounter.  Advised to seek care in the ED now for severe left flank/abdominal pain and explained the rationale for this.  She voiced understanding.   I discussed the assessment and treatment plan with the patient. The patient was provided an opportunity to ask questions and all were answered. The patient agreed with the plan and demonstrated an  understanding of the instructions.   The patient was advised to call back or seek an in-person evaluation if the symptoms worsen or if the condition fails to improve as anticipated.  I provided 5 minutes of non-face-to-face time during this encounter.

## 2020-05-05 ENCOUNTER — Ambulatory Visit: Payer: BC Managed Care – PPO | Admitting: Nurse Practitioner

## 2020-08-17 ENCOUNTER — Emergency Department: Payer: BC Managed Care – PPO

## 2020-08-17 ENCOUNTER — Other Ambulatory Visit: Payer: Self-pay

## 2020-08-17 ENCOUNTER — Observation Stay
Admission: EM | Admit: 2020-08-17 | Discharge: 2020-08-18 | Disposition: A | Discharge status: Home / Self Care | Payer: BC Managed Care – PPO | Attending: Surgery | Admitting: Surgery

## 2020-08-17 DIAGNOSIS — K828 Other specified diseases of gallbladder: Secondary | ICD-10-CM

## 2020-08-17 DIAGNOSIS — R109 Unspecified abdominal pain: Secondary | ICD-10-CM | POA: Diagnosis not present

## 2020-08-17 DIAGNOSIS — K810 Acute cholecystitis: Secondary | ICD-10-CM | POA: Diagnosis not present

## 2020-08-17 DIAGNOSIS — K8020 Calculus of gallbladder without cholecystitis without obstruction: Secondary | ICD-10-CM | POA: Diagnosis not present

## 2020-08-17 DIAGNOSIS — Z20822 Contact with and (suspected) exposure to covid-19: Secondary | ICD-10-CM | POA: Insufficient documentation

## 2020-08-17 DIAGNOSIS — R1110 Vomiting, unspecified: Secondary | ICD-10-CM | POA: Diagnosis not present

## 2020-08-17 DIAGNOSIS — R1011 Right upper quadrant pain: Secondary | ICD-10-CM | POA: Diagnosis present

## 2020-08-17 LAB — CBC WITH DIFFERENTIAL/PLATELET
Abs Immature Granulocytes: 0.05 10*3/uL (ref 0.00–0.07)
Basophils Absolute: 0.1 10*3/uL (ref 0.0–0.1)
Basophils Relative: 1 %
Eosinophils Absolute: 0.2 10*3/uL (ref 0.0–0.5)
Eosinophils Relative: 1 %
HCT: 36.9 % (ref 36.0–46.0)
Hemoglobin: 12.4 g/dL (ref 12.0–15.0)
Immature Granulocytes: 0 %
Lymphocytes Relative: 32 %
Lymphs Abs: 5.4 10*3/uL — ABNORMAL HIGH (ref 0.7–4.0)
MCH: 29.5 pg (ref 26.0–34.0)
MCHC: 33.6 g/dL (ref 30.0–36.0)
MCV: 87.6 fL (ref 80.0–100.0)
Monocytes Absolute: 0.8 10*3/uL (ref 0.1–1.0)
Monocytes Relative: 5 %
Neutro Abs: 10.4 10*3/uL — ABNORMAL HIGH (ref 1.7–7.7)
Neutrophils Relative %: 61 %
Platelets: 266 10*3/uL (ref 150–400)
RBC: 4.21 MIL/uL (ref 3.87–5.11)
RDW: 13.4 % (ref 11.5–15.5)
WBC: 17.6 10*3/uL — ABNORMAL HIGH (ref 4.0–10.5)
nRBC: 0 % (ref 0.0–0.2)

## 2020-08-17 LAB — COMPREHENSIVE METABOLIC PANEL
ALT: 19 U/L (ref 0–44)
AST: 21 U/L (ref 15–41)
Albumin: 4.1 g/dL (ref 3.5–5.0)
Alkaline Phosphatase: 55 U/L (ref 38–126)
Anion gap: 12 (ref 5–15)
BUN: 11 mg/dL (ref 6–20)
CO2: 22 mmol/L (ref 22–32)
Calcium: 9.1 mg/dL (ref 8.9–10.3)
Chloride: 101 mmol/L (ref 98–111)
Creatinine, Ser: 0.77 mg/dL (ref 0.44–1.00)
GFR, Estimated: >60 mL/min (ref >60)
Glucose, Bld: 170 mg/dL — ABNORMAL HIGH (ref 70–99)
Potassium: 3.6 mmol/L (ref 3.5–5.1)
Sodium: 135 mmol/L (ref 135–145)
Total Bilirubin: 0.7 mg/dL (ref 0.3–1.2)
Total Protein: 7.3 g/dL (ref 6.5–8.1)

## 2020-08-17 LAB — URINALYSIS, COMPLETE (UACMP) WITH MICROSCOPIC
Bilirubin Urine: NEGATIVE
Glucose, UA: NEGATIVE mg/dL
Hgb urine dipstick: NEGATIVE
Ketones, ur: 20 mg/dL — AB
Leukocytes,Ua: NEGATIVE
Nitrite: NEGATIVE
Protein, ur: NEGATIVE mg/dL
Specific Gravity, Urine: 1.017 (ref 1.005–1.030)
pH: 5 (ref 5.0–8.0)

## 2020-08-17 LAB — RESP PANEL BY RT-PCR (FLU A&B, COVID) ARPGX2
Influenza A by PCR: NEGATIVE
Influenza B by PCR: NEGATIVE
SARS Coronavirus 2 by RT PCR: NEGATIVE

## 2020-08-17 LAB — PREGNANCY, URINE: Preg Test, Ur: NEGATIVE

## 2020-08-17 LAB — LIPASE, BLOOD: Lipase: 33 U/L (ref 11–51)

## 2020-08-17 MED ORDER — ONDANSETRON 4 MG PO TBDP: 4.0000 mg | ORAL_TABLET | Freq: Four times a day (QID) | ORAL | Status: DC | PRN

## 2020-08-17 MED ORDER — SODIUM CHLORIDE 0.9 % IV SOLN
2.0000 g | INTRAVENOUS | Status: DC
  Administered 2020-08-17: 2 g via INTRAVENOUS
  Filled 2020-08-17: qty 20

## 2020-08-17 MED ORDER — MORPHINE SULFATE (PF) 4 MG/ML IV SOLN
4.0000 mg | INTRAVENOUS | Status: DC | PRN
  Administered 2020-08-17 (×2): 4 mg via INTRAVENOUS
  Filled 2020-08-17 (×2): qty 1

## 2020-08-17 MED ORDER — MORPHINE SULFATE (PF) 2 MG/ML IV SOLN: 2.0000 mg | INTRAVENOUS | Status: DC | PRN

## 2020-08-17 MED ORDER — KCL IN DEXTROSE-NACL 20-5-0.45 MEQ/L-%-% IV SOLN
INTRAVENOUS | Status: DC
  Filled 2020-08-17 (×6): qty 1000

## 2020-08-17 MED ORDER — PIPERACILLIN-TAZOBACTAM 3.375 G IVPB
3.3750 g | Freq: Three times a day (TID) | INTRAVENOUS | Status: DC
  Administered 2020-08-17 – 2020-08-18 (×3): 3.375 g via INTRAVENOUS
  Filled 2020-08-17 (×2): qty 50

## 2020-08-17 MED ORDER — IOHEXOL 300 MG/ML  SOLN
100.0000 mL | Freq: Once | INTRAMUSCULAR | Status: AC | PRN
  Administered 2020-08-17: 100 mL via INTRAVENOUS

## 2020-08-17 MED ORDER — PROMETHAZINE HCL 25 MG/ML IJ SOLN
12.5000 mg | Freq: Four times a day (QID) | INTRAMUSCULAR | Status: DC | PRN
  Administered 2020-08-17: 12.5 mg via INTRAVENOUS
  Filled 2020-08-17: qty 1

## 2020-08-17 MED ORDER — KETOROLAC TROMETHAMINE 30 MG/ML IJ SOLN
30.0000 mg | Freq: Four times a day (QID) | INTRAMUSCULAR | Status: DC
  Administered 2020-08-17 – 2020-08-18 (×4): 30 mg via INTRAVENOUS
  Filled 2020-08-17 (×4): qty 1

## 2020-08-17 MED ORDER — ACETAMINOPHEN 500 MG PO TABS
1000.0000 mg | ORAL_TABLET | Freq: Four times a day (QID) | ORAL | Status: DC
  Administered 2020-08-17: 1000 mg via ORAL
  Filled 2020-08-17 (×3): qty 2

## 2020-08-17 MED ORDER — SODIUM CHLORIDE 0.9 % IV SOLN: INTRAVENOUS | Status: DC

## 2020-08-17 MED ORDER — ONDANSETRON HCL 4 MG/2ML IJ SOLN
4.0000 mg | Freq: Four times a day (QID) | INTRAMUSCULAR | Status: DC | PRN
  Administered 2020-08-17: 4 mg via INTRAVENOUS
  Filled 2020-08-17: qty 2

## 2020-08-17 MED ORDER — KETOROLAC TROMETHAMINE 30 MG/ML IJ SOLN: 15.0000 mg | Freq: Once | INTRAMUSCULAR | Status: DC

## 2020-08-17 MED ORDER — SODIUM CHLORIDE 0.9 % IV BOLUS
1000.0000 mL | Freq: Once | INTRAVENOUS | Status: AC
  Administered 2020-08-17: 1000 mL via INTRAVENOUS

## 2020-08-17 MED ORDER — HYDROMORPHONE HCL 1 MG/ML IJ SOLN
0.5000 mg | INTRAMUSCULAR | Status: AC | PRN
  Administered 2020-08-17: 0.5 mg via INTRAVENOUS
  Filled 2020-08-17: qty 1

## 2020-08-17 NOTE — ED Notes (Signed)
Pt presents to ED with c/o of severe upper back and RUQ pain that started yesterday. Pt states a brief episode yesterday but states it subsided since and states last night it became unbearable. Pt presents tearful due to pain and pt also presents diaphoretic. Pt denies urinary symptoms or HX of kidneys tones. Pt is A&Ox4. Pt denies fevers or chills. Pt presents vomiting due to pain as well.

## 2020-08-17 NOTE — H&P (Signed)
[facility] SURGICAL HISTORY & PHYSICAL (cpt [phone])  HISTORY OF PRESENT ILLNESS (HPI):  24 y.o. female presented to [facility] ED today for abdominal pain. Patient reports the acute onset of pain Monday night into Tuesday morning while she was at work. This was initially severe back back which radiated across her upper abdomen. This subsided but she quickly noticed a return of sharp cramping pain across her abdomen and localized towards her RUQ. She thought maybe this was related to menstrual cramping but got no relief from home remedies. She also endorsed associated nausea and non-bloody emesis with the pain. No fever, chills, cough, CP, SOB, urinary changes, or bowel changes. No juandice. She denied any history of similar in the past. No previous abdominal surgeries. Work up in the ED was concerning for leukocytosis to 17K and was found to have cholelithiasis on RUQ [place].   General surgery is consulted by emergency medicine physician Dr Willy Eddy, MD for evaluation and management of RUQ pain   PAST MEDICAL HISTORY (PMH):  Past Medical History:  Diagnosis Date  . Abnormal Pap smear of cervix 06/2017  . Allergy   . FH: colon cancer in relative <17 years old 12/09/2019  . GERD (gastroesophageal reflux disease)    OCC-TUMS PRN  . History of chlamydia 2018   Found by PAP smear and Rx for azithromycin and diflucan  . Obesity (BMI 30.0-34.9)     Reviewed. Otherwise negative.   PAST SURGICAL HISTORY (PSH):  Past Surgical History:  Procedure Laterality Date  . SIGMOIDOSCOPY N/A 06/13/2017   Procedure: SIGMOIDOSCOPY;  Surgeon: Kieth Brightly, MD;  Location: [facility] ORS;  Service: General;  Laterality: N/A;  . SPHINCTEROTOMY N/A 06/13/2017   Procedure: LATERAL INTERNAL SPHINCTEROTOMY;  Surgeon: Kieth Brightly, MD;  Location: [facility] ORS;  Service: General;  Laterality: N/A;    Reviewed. Otherwise negative.   MEDICATIONS:  Prior to Admission medications   Medication  Sig Start Date End Date Taking? Authorizing Provider  norgestimate-ethinyl estradiol (SPRINTEC 28) 0.25-35 MG-MCG tablet Take 1 tablet by mouth daily. 02/04/20   Lawhorn, Vanessa Mendenhall, CNM     ALLERGIES:  No Known Allergies   SOCIAL HISTORY:  Social History   Socioeconomic History  . Marital status: Single    Spouse name: Not on file  . Number of children: Not on file  . Years of education: Not on file  . Highest education level: Not on file  Occupational History  . Not on file  Tobacco Use  . Smoking status: Never Smoker  . Smokeless tobacco: Never Used  Vaping Use  . Vaping Use: Never used  Substance and Sexual Activity  . Alcohol use: Yes    Alcohol/week: 6.0 standard drinks    Types: 6 Cans of beer per week    Comment: weekends   . Drug use: No  . Sexual activity: Yes    Birth control/protection: Pill  Other Topics Concern  . Not on file  Social History Narrative  . Not on file   Social Determinants of Health   Financial Resource Strain:   . Difficulty of Paying Living Expenses: Not on file  Food Insecurity:   . Worried About Programme researcher, broadcasting/film/video in the Last Year: Not on file  . Ran Out of Food in the Last Year: Not on file  Transportation Needs:   . Lack of Transportation (Medical): Not on file  . Lack of Transportation (Non-Medical): Not on file  Physical Activity:   . Days of  Exercise per Week: Not on file  . Minutes of Exercise per Session: Not on file  Stress:   . Feeling of Stress : Not on file  Social Connections:   . Frequency of Communication with Friends and Family: Not on file  . Frequency of Social Gatherings with Friends and Family: Not on file  . Attends Religious Services: Not on file  . Active Member of Clubs or Organizations: Not on file  . Attends Banker Meetings: Not on file  . Marital Status: Not on file  Intimate Partner Violence:   . Fear of Current or Ex-Partner: Not on file  . Emotionally Abused: Not on file  .  Physically Abused: Not on file  . Sexually Abused: Not on file     FAMILY HISTORY:  Family History  Problem Relation Age of Onset  . Colon cancer Father 77  . Colon polyps Paternal Uncle   . Cancer Paternal Grandmother     Otherwise negative.   REVIEW OF SYSTEMS:  Review of Systems  Constitutional: Negative for chills and fever.  HENT: Negative for congestion and sore throat.   Respiratory: Negative for cough and shortness of breath.   Cardiovascular: Negative for chest pain and palpitations.  Gastrointestinal: Positive for abdominal pain, nausea and vomiting. Negative for blood in stool, constipation and diarrhea.  Genitourinary: Negative for dysuria and urgency.  All other systems reviewed and are negative.   VITAL SIGNS:  Temp:  [98.1 F (36.7 C)] 98.1 F (36.7 C) (12/01 1155) Pulse Rate:  [51-67] 60 (12/01 1500) Resp:  [16-22] 16 (12/01 1500) BP: (103-108)/(61-72) 103/61 (12/01 1500) SpO2:  [98 %-100 %] 100 % (12/01 1500) Weight:  [79.4 kg] 79.4 kg (12/01 1003)     Height: 5\' 4"  (162.6 cm) Weight: 79.4 kg BMI (Calculated): 30.02   PHYSICAL EXAM:  Physical Exam Vitals and nursing note reviewed. Exam conducted with a chaperone present.  Constitutional:      General: She is not in acute distress.    Appearance: She is well-developed and normal weight. She is not ill-appearing.  HENT:     Head: Normocephalic and atraumatic.  Eyes:     General: No scleral icterus.    Extraocular Movements: Extraocular movements intact.  Cardiovascular:     Rate and Rhythm: Normal rate.     Heart sounds: Normal heart sounds. No murmur heard.   Pulmonary:     Effort: Pulmonary effort is normal. No respiratory distress.     Breath sounds: Normal breath sounds.  Abdominal:     Palpations: Abdomen is soft.     Tenderness: There is abdominal tenderness in the right upper quadrant. There is no guarding or rebound. Positive signs include Murphy's sign.  Genitourinary:    Comments:  Deferred Skin:    General: Skin is warm and dry.     Coloration: Skin is not jaundiced or pale.  Neurological:     General: No focal deficit present.     Mental Status: She is alert and oriented to person, place, and time.  Psychiatric:        Mood and Affect: Mood normal.        Behavior: Behavior normal.     INTAKE/OUTPUT:  This shift: No intake/output data recorded.  Last 2 shifts: @IOLAST2SHIFTS @  Labs:  CBC Latest Ref Rng & Units 08/17/2020 12/08/2019  WBC 4.0 - 10.5 K/uL 17.6(H) 8.0  Hemoglobin 12.0 - 15.0 g/dL 14/09/2019 12/10/2019  Hematocrit 36 - 46 % 36.9 38.0  Platelets 150 - 400 K/uL 266 231.0   CMP Latest Ref Rng & Units 08/17/2020 12/08/2019 02/21/2012  Glucose 70 - 99 mg/dL 892(J) 194(R) -  BUN 6 - 20 mg/dL 11 11 -  Creatinine 7.40 - 1.00 mg/dL 8.14 4.81 -  Sodium 856 - 145 mmol/L 135 138 -  Potassium 3.5 - 5.1 mmol/L 3.6 3.8 -  Chloride 98 - 111 mmol/L 101 104 -  CO2 22 - 32 mmol/L 22 26 -  Calcium 8.9 - 10.3 mg/dL 9.1 9.2 -  Total Protein 6.5 - 8.1 g/dL 7.3 7.1 7.5  Total Bilirubin 0.3 - 1.2 mg/dL 0.7 0.3 0.5  Alkaline Phos 38 - 126 U/L 55 53 84  AST 15 - 41 U/L 21 22 20   ALT 0 - 44 U/L 19 15 18      Imaging studies:    RUQ (08/17/2020) personally reviewed showing cholelithiasis without changes consistent with cholecystitis, and radiologist report reviewed below:   IMPRESSION: Cholelithiasis without evidence of cholecystitis.  CT Abdomen/Pelvis (08/17/2020) personally reviewed which is unremarkable, and radiologist report reviewed below:  IMPRESSION: 1. Normal enhanced CT scan of the abdomen and pelvis. Appendix top normal in size with no adjacent inflammation to suggest Appendicitis.   Assessment/Plan: (ICD-10's: R10.11) 24 y.o. female with RUQ abdominal pain and leukocytosis found to have cholelithiasis without any gross evidence of cholecystitis nor choledocholithiasis   - Given the clinical concern for cholecystitis without radiographic findings, we will  recommend proceeding with HIDA scan to definitively rule in/out cholecystitis. Unfortunately, this can not be done until tomorrow morning at 0900. As such, we will admit her to the hospital for observation for now. Pending HIDA results and clinical condition we will either proceed with laparoscopic cholecystectomy tomorrow if indicated or she can be discharged home if negative and feeling improved. She is in agreement with this.   - CLD; NPO at midnight  - I will start ABx (Rocephin)  - Pain control prn (We will need to stop narcotics 6 hours before imaging)  - Monitor abdominal examination  - Monitor leukocytosis  - Mobilization as tolerated   All of the above findings and recommendations were discussed with the patient and her family, and all of their questions were answered to their expressed satisfaction.  -- 14/09/2019, PA-C [facility] 08/17/2020, 4:15 PM [phone] M-F: 7am - 4pm

## 2020-08-17 NOTE — ED Triage Notes (Signed)
Pt c/o back pain that wraps around to the RUQ that started yesterday and eased off and then came back today with N/V, diaphoresis. Denies injury or other sx., pt is pale in color and not able to sit still.

## 2020-08-17 NOTE — ED Notes (Signed)
Messaged floor RN Kennyth Arnold, to give report. Awaiting response.

## 2020-08-17 NOTE — Consult Note (Signed)
Pharmacy Antibiotic Note  Katelyn Hall is a 24 y.o. female admitted on 08/17/2020 with Intra-Abdominal Infection.  Pharmacy has been consulted for Zosyn dosing.  Plan: Zosyn 3.375g IV q8h (4 hour infusion).  Height: 5\' 4"  (162.6 cm) Weight: 79.4 kg (175 lb) IBW/kg (Calculated) : 54.7  Temp (24hrs), Avg:98.1 F (36.7 C), Min:98.1 F (36.7 C), Max:98.1 F (36.7 C)  Recent Labs  Lab 08/17/20 1030  WBC 17.6*  CREATININE 0.77    Estimated Creatinine Clearance: 109.6 mL/min (by C-G formula based on SCr of 0.77 mg/dL).    No Known Allergies  Antimicrobials this admission: Ongoing Zosyn 3.375g q8h (ext inf - 4hrs) (12/1 >>  Completed 12/1 Ceftriaxone 2g x1  Microbiology results: N/A  Thank you for allowing pharmacy to be a part of this patient's care.  14/1 08/17/2020 5:13 PM

## 2020-08-17 NOTE — ED Provider Notes (Signed)
[facility] Emergency Department Provider Note    First MD Initiated Contact with Patient 08/17/20 1019     (approximate)  I have reviewed the triage vital signs and the nursing notes.   HISTORY  Chief Complaint Abdominal Pain    HPI Katelyn Hall is a 24 y.o. female the below listed past medical history presents to the ER for evaluation of acute epigastric and right upper quadrant and bilateral back pain that started while she was finishing her third shift work this evening.  This is she was having some discomfort yesterday but it subsided.  She denies any dysuria.  No blood in urine.  No discharge.  No diarrhea.  Is having associated nausea secondary to pain.  Is never had pain like this before.  No history of kidney stones.  No previous abdominal surgeries.  No measured fevers.    Past Medical History:  Diagnosis Date  . Abnormal Pap smear of cervix 06/2017  . Allergy   . FH: colon cancer in relative <76 years old 12/09/2019  . GERD (gastroesophageal reflux disease)    OCC-TUMS PRN  . History of chlamydia 2018   Found by PAP smear and Rx for azithromycin and diflucan  . Obesity (BMI 30.0-34.9)    Family History  Problem Relation Age of Onset  . Colon cancer Father 84  . Colon polyps Paternal Uncle   . Cancer Paternal Grandmother    Past Surgical History:  Procedure Laterality Date  . SIGMOIDOSCOPY N/A 06/13/2017   Procedure: SIGMOIDOSCOPY;  Surgeon: Kieth Brightly, MD;  Location: [facility] ORS;  Service: General;  Laterality: N/A;  . SPHINCTEROTOMY N/A 06/13/2017   Procedure: LATERAL INTERNAL SPHINCTEROTOMY;  Surgeon: Kieth Brightly, MD;  Location: [facility] ORS;  Service: General;  Laterality: N/A;   Patient Active Problem List   Diagnosis Date Noted  . Left flank pain 05/02/2020  . Left lower quadrant abdominal pain 05/02/2020  . BMI 33.0-33.9,adult 12/09/2019  . History of abnormal cervical Pap smear 12/09/2019  . FH: colon cancer  in relative <29 years old 12/09/2019      Prior to Admission medications   Medication Sig Start Date End Date Taking? Authorizing Provider  norgestimate-ethinyl estradiol (SPRINTEC 28) 0.25-35 MG-MCG tablet Take 1 tablet by mouth daily. 02/04/20   Gunnar Bulla, CNM    Allergies Patient has no known allergies.    Social History Social History   Tobacco Use  . Smoking status: Never Smoker  . Smokeless tobacco: Never Used  Vaping Use  . Vaping Use: Never used  Substance Use Topics  . Alcohol use: Yes    Alcohol/week: 6.0 standard drinks    Types: 6 Cans of beer per week    Comment: weekends   . Drug use: No    Review of Systems Patient denies headaches, rhinorrhea, blurry vision, numbness, shortness of breath, chest pain, edema, cough, abdominal pain, nausea, vomiting, diarrhea, dysuria, fevers, rashes or hallucinations unless otherwise stated above in HPI. ____________________________________________   PHYSICAL EXAM:  VITAL SIGNS: Vitals:   08/17/20 1402 08/17/20 1500  BP: 104/70 103/61  Pulse: (!) 52 60  Resp: 18 16  Temp:    SpO2: 98% 100%    Constitutional: Alert and oriented.  Eyes: Conjunctivae are normal.  Head: Atraumatic. Nose: No congestion/rhinnorhea. Mouth/Throat: Mucous membranes are moist.   Neck: No stridor. Painless ROM.  Cardiovascular: Normal rate, regular rhythm. Grossly normal heart sounds.  Good peripheral circulation. Respiratory: Normal respiratory effort.  No  retractions. Lungs CTAB. Gastrointestinal: Soft with ttp in RUQ. No distention. No abdominal bruits. No CVA tenderness. Genitourinary:  Musculoskeletal: No lower extremity tenderness nor edema.  No joint effusions. Neurologic:  Normal speech and language. No gross focal neurologic deficits are appreciated. No facial droop Skin:  Skin is warm, dry and intact. No rash noted. Psychiatric: Mood and affect are normal. Speech and behavior are  normal.  ____________________________________________   LABS (all labs ordered are listed, but only abnormal results are displayed)  Results for orders placed or performed during the hospital encounter of 08/17/20 (from the past 24 hour(s))  CBC with Differential     Status: Abnormal   Collection Time: 08/17/20 10:30 AM  Result Value Ref Range   WBC 17.6 (H) 4.0 - 10.5 K/uL   RBC 4.21 3.87 - 5.11 MIL/uL   Hemoglobin 12.4 12.0 - 15.0 g/dL   HCT 63.0 36 - 46 %   MCV 87.6 80.0 - 100.0 fL   MCH 29.5 26.0 - 34.0 pg   MCHC 33.6 30.0 - 36.0 g/dL   RDW 16.0 10.9 - 32.3 %   Platelets 266 150 - 400 K/uL   nRBC 0.0 0.0 - 0.2 %   Neutrophils Relative % 61 %   Neutro Abs 10.4 (H) 1.7 - 7.7 K/uL   Lymphocytes Relative 32 %   Lymphs Abs 5.4 (H) 0.7 - 4.0 K/uL   Monocytes Relative 5 %   Monocytes Absolute 0.8 0.1 - 1.0 K/uL   Eosinophils Relative 1 %   Eosinophils Absolute 0.2 0.0 - 0.5 K/uL   Basophils Relative 1 %   Basophils Absolute 0.1 0.0 - 0.1 K/uL   Immature Granulocytes 0 %   Abs Immature Granulocytes 0.05 0.00 - 0.07 K/uL  Comprehensive metabolic panel     Status: Abnormal   Collection Time: 08/17/20 10:30 AM  Result Value Ref Range   Sodium 135 135 - 145 mmol/L   Potassium 3.6 3.5 - 5.1 mmol/L   Chloride 101 98 - 111 mmol/L   CO2 22 22 - 32 mmol/L   Glucose, Bld 170 (H) 70 - 99 mg/dL   BUN 11 6 - 20 mg/dL   Creatinine, Ser 5.57 0.44 - 1.00 mg/dL   Calcium 9.1 8.9 - 32.2 mg/dL   Total Protein 7.3 6.5 - 8.1 g/dL   Albumin 4.1 3.5 - 5.0 g/dL   AST 21 15 - 41 U/L   ALT 19 0 - 44 U/L   Alkaline Phosphatase 55 38 - 126 U/L   Total Bilirubin 0.7 0.3 - 1.2 mg/dL   GFR, Estimated >02 >54 mL/min   Anion gap 12 5 - 15  Lipase, blood     Status: None   Collection Time: 08/17/20 10:30 AM  Result Value Ref Range   Lipase 33 11 - 51 U/L  Urinalysis, Complete w Microscopic Urine, Clean Catch     Status: Abnormal   Collection Time: 08/17/20 11:39 AM  Result Value Ref Range    Color, Urine YELLOW (A) YELLOW   APPearance CLEAR (A) CLEAR   Specific Gravity, Urine 1.017 1.005 - 1.030   pH 5.0 5.0 - 8.0   Glucose, UA NEGATIVE NEGATIVE mg/dL   Hgb urine dipstick NEGATIVE NEGATIVE   Bilirubin Urine NEGATIVE NEGATIVE   Ketones, ur 20 (A) NEGATIVE mg/dL   Protein, ur NEGATIVE NEGATIVE mg/dL   Nitrite NEGATIVE NEGATIVE   Leukocytes,Ua NEGATIVE NEGATIVE   RBC / HPF 0-5 0 - 5 RBC/hpf   WBC, UA 0-5  0 - 5 WBC/hpf   Bacteria, UA RARE (A) NONE SEEN   Squamous Epithelial / LPF 0-5 0 - 5   Mucus PRESENT   Resp Panel by RT-PCR (Flu A&B, Covid) Nasopharyngeal Swab     Status: None   Collection Time: 08/17/20 11:42 AM   Specimen: Nasopharyngeal Swab; Nasopharyngeal(NP) swabs in vial transport medium  Result Value Ref Range   SARS Coronavirus 2 by RT PCR NEGATIVE NEGATIVE   Influenza A by PCR NEGATIVE NEGATIVE   Influenza B by PCR NEGATIVE NEGATIVE  Pregnancy, urine     Status: None   Collection Time: 08/17/20  1:35 PM  Result Value Ref Range   Preg Test, Ur NEGATIVE NEGATIVE   ____________________________________________   ____________________________________________  RADIOLOGY  I personally reviewed all radiographic images ordered to evaluate for the above acute complaints and reviewed radiology reports and findings.  These findings were personally discussed with the patient.  Please see medical record for radiology report.  ____________________________________________   PROCEDURES  Procedure(s) performed:  Procedures    Critical Care performed: no ____________________________________________   INITIAL IMPRESSION / ASSESSMENT AND PLAN / ED COURSE  Pertinent labs & imaging results that were available during my care of the patient were reviewed by me and considered in my medical decision making (see chart for details).   DDX: Cholecystitis, cholelithiasis, pancreatitis, enteritis, colitis, appendicitis, pyelonephritis, stone  Jamari S Mayford Knifeurner is a 24 y.o.  who presents to the ED with presentation as described above.  Patient quite uncomfortable appearing upon arrival.  Does have right-sided primarily right upper quadrant abdominal tenderness with radiation through to her back blood will be sent for the but differential.  I am concerned for biliary pathology therefore will order right upper quadrant ultrasound will order IV narcotic medication as well as IV antiemetic and IV fluids.  Clinical Course as of Aug 17 1530  Wed Aug 17, 2020  1329 Repeat exam is improved.  Still has some mild epigastric tenderness.  Given her pain referring through to the back do want to check to make sure her urine is not showing signs of infection.  She is not febrile but her white count is a bit concerning.  Given her symptoms will order CT which is in a retrocecal appendicitis referred pain.  Anticipate she will need surgical consult.   [PR]  1530 As discussed with Dr. Aleen CampiPiscoya of general surgery.  His team will come evaluate patient at bedside.  He is reviewed the images based on presentation has requested HIDA scan be ordered to further differentiate whether she has early cholecystitis.  Patient be signed out to oncoming provider pending surgery consultation.   [PR]    Clinical Course User Index [PR] Willy Eddyobinson, Josi Roediger, MD    The patient was evaluated in Emergency Department today for the symptoms described in the history of present illness. He/she was evaluated in the context of the global COVID-19 pandemic, which necessitated consideration that the patient might be at risk for infection with the SARS-CoV-2 virus that causes COVID-19. Institutional protocols and algorithms that pertain to the evaluation of patients at risk for COVID-19 are in a state of rapid change based on information released by regulatory bodies including the CDC and federal and state organizations. These policies and algorithms were followed during the patient's care in the ED.  As part of my medical  decision making, I reviewed the following data within the electronic MEDICAL RECORD NUMBER Nursing notes reviewed and incorporated, Labs reviewed, notes from prior  ED visits and [place] Controlled Substance Database   ____________________________________________   FINAL CLINICAL IMPRESSION(S) / ED DIAGNOSES  Final diagnoses:  RUQ abdominal pain      NEW MEDICATIONS STARTED DURING THIS VISIT:  New Prescriptions   No medications on file     Note:  This document was prepared using Dragon voice recognition software and may include unintentional dictation errors.    Willy Eddy, MD 08/17/20 [phone]

## 2020-08-18 ENCOUNTER — Encounter
Admission: EM | Disposition: A | Discharge status: Home / Self Care | Payer: Self-pay | Source: Home / Self Care | Attending: Student in an Organized Health Care Education/Training Program

## 2020-08-18 ENCOUNTER — Emergency Department: Payer: BC Managed Care – PPO

## 2020-08-18 ENCOUNTER — Observation Stay: Payer: BC Managed Care – PPO | Admitting: Anesthesiology

## 2020-08-18 DIAGNOSIS — K8000 Calculus of gallbladder with acute cholecystitis without obstruction: Secondary | ICD-10-CM | POA: Diagnosis not present

## 2020-08-18 DIAGNOSIS — R1011 Right upper quadrant pain: Secondary | ICD-10-CM | POA: Diagnosis not present

## 2020-08-18 DIAGNOSIS — K828 Other specified diseases of gallbladder: Secondary | ICD-10-CM

## 2020-08-18 DIAGNOSIS — K8012 Calculus of gallbladder with acute and chronic cholecystitis without obstruction: Secondary | ICD-10-CM | POA: Diagnosis not present

## 2020-08-18 DIAGNOSIS — K810 Acute cholecystitis: Secondary | ICD-10-CM | POA: Diagnosis not present

## 2020-08-18 LAB — CBC
HCT: 34.1 % — ABNORMAL LOW (ref 36.0–46.0)
Hemoglobin: 11.4 g/dL — ABNORMAL LOW (ref 12.0–15.0)
MCH: 29.8 pg (ref 26.0–34.0)
MCHC: 33.4 g/dL (ref 30.0–36.0)
MCV: 89.3 fL (ref 80.0–100.0)
Platelets: 219 10*3/uL (ref 150–400)
RBC: 3.82 MIL/uL — ABNORMAL LOW (ref 3.87–5.11)
RDW: 13.7 % (ref 11.5–15.5)
WBC: 10.7 10*3/uL — ABNORMAL HIGH (ref 4.0–10.5)
nRBC: 0 % (ref 0.0–0.2)

## 2020-08-18 LAB — COMPREHENSIVE METABOLIC PANEL
ALT: 16 U/L (ref 0–44)
AST: 14 U/L — ABNORMAL LOW (ref 15–41)
Albumin: 3 g/dL — ABNORMAL LOW (ref 3.5–5.0)
Alkaline Phosphatase: 46 U/L (ref 38–126)
Anion gap: 5 (ref 5–15)
BUN: 5 mg/dL — ABNORMAL LOW (ref 6–20)
CO2: 24 mmol/L (ref 22–32)
Calcium: 8.3 mg/dL — ABNORMAL LOW (ref 8.9–10.3)
Chloride: 108 mmol/L (ref 98–111)
Creatinine, Ser: 0.73 mg/dL (ref 0.44–1.00)
GFR, Estimated: >60 mL/min (ref >60)
Glucose, Bld: 113 mg/dL — ABNORMAL HIGH (ref 70–99)
Potassium: 3.9 mmol/L (ref 3.5–5.1)
Sodium: 137 mmol/L (ref 135–145)
Total Bilirubin: 0.9 mg/dL (ref 0.3–1.2)
Total Protein: 5.8 g/dL — ABNORMAL LOW (ref 6.5–8.1)

## 2020-08-18 LAB — HIV ANTIBODY (ROUTINE TESTING W REFLEX): HIV Screen 4th Generation wRfx: NONREACTIVE

## 2020-08-18 SURGERY — CHOLECYSTECTOMY, ROBOT-ASSISTED, LAPAROSCOPIC
Anesthesia: General

## 2020-08-18 MED ORDER — FENTANYL CITRATE (PF) 100 MCG/2ML IJ SOLN
INTRAMUSCULAR | Status: AC
  Filled 2020-08-18: qty 2

## 2020-08-18 MED ORDER — AMOXICILLIN-POT CLAVULANATE 875-125 MG PO TABS: 1.0000 | ORAL_TABLET | Freq: Two times a day (BID) | ORAL | 0 refills | Status: AC

## 2020-08-18 MED ORDER — PROMETHAZINE HCL 25 MG/ML IJ SOLN
6.2500 mg | INTRAMUSCULAR | Status: DC | PRN
  Administered 2020-08-18: 6.25 mg via INTRAVENOUS

## 2020-08-18 MED ORDER — SODIUM CHLORIDE FLUSH 0.9 % IV SOLN
INTRAVENOUS | Status: AC
  Filled 2020-08-18: qty 10

## 2020-08-18 MED ORDER — LIDOCAINE HCL (CARDIAC) PF 100 MG/5ML IV SOSY
PREFILLED_SYRINGE | INTRAVENOUS | Status: DC | PRN
  Administered 2020-08-18: 100 mg via INTRAVENOUS

## 2020-08-18 MED ORDER — EPHEDRINE SULFATE 50 MG/ML IJ SOLN
INTRAMUSCULAR | Status: DC | PRN
  Administered 2020-08-18: 10 mg via INTRAVENOUS

## 2020-08-18 MED ORDER — ONDANSETRON HCL 4 MG/2ML IJ SOLN
INTRAMUSCULAR | Status: DC | PRN
  Administered 2020-08-18: 4 mg via INTRAVENOUS

## 2020-08-18 MED ORDER — PROMETHAZINE HCL 25 MG/ML IJ SOLN
INTRAMUSCULAR | Status: AC
  Filled 2020-08-18: qty 1

## 2020-08-18 MED ORDER — PROPOFOL 10 MG/ML IV BOLUS
INTRAVENOUS | Status: AC
  Filled 2020-08-18: qty 20

## 2020-08-18 MED ORDER — FENTANYL CITRATE (PF) 100 MCG/2ML IJ SOLN
25.0000 ug | INTRAMUSCULAR | Status: AC | PRN
  Administered 2020-08-18 (×4): 25 ug via INTRAVENOUS

## 2020-08-18 MED ORDER — ACETAMINOPHEN 10 MG/ML IV SOLN
INTRAVENOUS | Status: DC | PRN
  Administered 2020-08-18: 1000 mg via INTRAVENOUS

## 2020-08-18 MED ORDER — DEXMEDETOMIDINE (PRECEDEX) IN NS 20 MCG/5ML (4 MCG/ML) IV SYRINGE
PREFILLED_SYRINGE | INTRAVENOUS | Status: DC | PRN
  Administered 2020-08-18: 20 ug via INTRAVENOUS

## 2020-08-18 MED ORDER — IBUPROFEN 600 MG PO TABS: 600.0000 mg | ORAL_TABLET | Freq: Three times a day (TID) | ORAL | 1 refills | Status: DC | PRN

## 2020-08-18 MED ORDER — LORAZEPAM 2 MG/ML IJ SOLN: 1.0000 mg | Freq: Once | INTRAMUSCULAR | Status: DC | PRN

## 2020-08-18 MED ORDER — INDOCYANINE GREEN 25 MG IV SOLR
2.5000 mg | Freq: Once | INTRAVENOUS | Status: AC
  Administered 2020-08-18: 2.5 mg via INTRAVENOUS
  Filled 2020-08-18: qty 1

## 2020-08-18 MED ORDER — SUGAMMADEX SODIUM 200 MG/2ML IV SOLN
INTRAVENOUS | Status: DC | PRN
  Administered 2020-08-18: 200 mg via INTRAVENOUS

## 2020-08-18 MED ORDER — HYDROMORPHONE HCL 1 MG/ML IJ SOLN: 0.2500 mg | INTRAMUSCULAR | Status: DC | PRN

## 2020-08-18 MED ORDER — ONDANSETRON HCL 4 MG/2ML IJ SOLN
INTRAMUSCULAR | Status: AC
  Filled 2020-08-18: qty 2

## 2020-08-18 MED ORDER — OXYCODONE HCL 5 MG PO TABS: 5.0000 mg | ORAL_TABLET | Freq: Once | ORAL | Status: DC | PRN

## 2020-08-18 MED ORDER — MIDAZOLAM HCL 2 MG/2ML IJ SOLN
INTRAMUSCULAR | Status: AC
  Filled 2020-08-18: qty 2

## 2020-08-18 MED ORDER — PIPERACILLIN-TAZOBACTAM 3.375 G IVPB
INTRAVENOUS | Status: AC
  Filled 2020-08-18: qty 50

## 2020-08-18 MED ORDER — PROPOFOL 10 MG/ML IV BOLUS
INTRAVENOUS | Status: DC | PRN
  Administered 2020-08-18: 200 mg via INTRAVENOUS

## 2020-08-18 MED ORDER — LACTATED RINGERS IV SOLN: INTRAVENOUS | Status: DC | PRN

## 2020-08-18 MED ORDER — TECHNETIUM TC 99M MEBROFENIN IV KIT
5.0000 | PACK | Freq: Once | INTRAVENOUS | Status: AC | PRN
  Administered 2020-08-18: 5.091 via INTRAVENOUS

## 2020-08-18 MED ORDER — BUPIVACAINE-EPINEPHRINE (PF) 0.25% -1:200000 IJ SOLN
INTRAMUSCULAR | Status: DC | PRN
  Administered 2020-08-18: 30 mL

## 2020-08-18 MED ORDER — KETOROLAC TROMETHAMINE 30 MG/ML IJ SOLN
INTRAMUSCULAR | Status: AC
  Filled 2020-08-18: qty 1

## 2020-08-18 MED ORDER — DEXAMETHASONE SODIUM PHOSPHATE 10 MG/ML IJ SOLN
INTRAMUSCULAR | Status: DC | PRN
  Administered 2020-08-18: 10 mg via INTRAVENOUS

## 2020-08-18 MED ORDER — OXYCODONE HCL 5 MG PO TABS: 5.0000 mg | ORAL_TABLET | ORAL | Status: DC | PRN

## 2020-08-18 MED ORDER — BUPIVACAINE-EPINEPHRINE (PF) 0.25% -1:200000 IJ SOLN
INTRAMUSCULAR | Status: AC
  Filled 2020-08-18: qty 30

## 2020-08-18 MED ORDER — FENTANYL CITRATE (PF) 100 MCG/2ML IJ SOLN
INTRAMUSCULAR | Status: DC | PRN
  Administered 2020-08-18 (×4): 50 ug via INTRAVENOUS

## 2020-08-18 MED ORDER — MIDAZOLAM HCL 2 MG/2ML IJ SOLN
INTRAMUSCULAR | Status: DC | PRN
  Administered 2020-08-18: 2 mg via INTRAVENOUS

## 2020-08-18 MED ORDER — MEPERIDINE HCL 50 MG/ML IJ SOLN: 6.2500 mg | INTRAMUSCULAR | Status: DC | PRN

## 2020-08-18 MED ORDER — DROPERIDOL 2.5 MG/ML IJ SOLN
0.6250 mg | Freq: Once | INTRAMUSCULAR | Status: DC | PRN
  Filled 2020-08-18: qty 2

## 2020-08-18 MED ORDER — DEXAMETHASONE SODIUM PHOSPHATE 10 MG/ML IJ SOLN
INTRAMUSCULAR | Status: AC
  Filled 2020-08-18: qty 1

## 2020-08-18 MED ORDER — OXYCODONE HCL 5 MG PO TABS: 5.0000 mg | ORAL_TABLET | ORAL | 0 refills | Status: DC | PRN

## 2020-08-18 MED ORDER — ROCURONIUM BROMIDE 100 MG/10ML IV SOLN
INTRAVENOUS | Status: DC | PRN
  Administered 2020-08-18: 20 mg via INTRAVENOUS
  Administered 2020-08-18: 50 mg via INTRAVENOUS

## 2020-08-18 MED ORDER — OXYCODONE HCL 5 MG/5ML PO SOLN: 5.0000 mg | Freq: Once | ORAL | Status: DC | PRN

## 2020-08-18 MED ORDER — ACETAMINOPHEN 10 MG/ML IV SOLN
INTRAVENOUS | Status: AC
  Filled 2020-08-18: qty 100

## 2020-08-18 SURGICAL SUPPLY — 54 items
BAG INFUSER PRESSURE 100CC (MISCELLANEOUS) IMPLANT
CANISTER SUCT 1200ML W/VALVE (MISCELLANEOUS) IMPLANT
CANNULA REDUC XI 12-8 STAPL (CANNULA) ×1
CANNULA REDUC XI 12-8MM STAPL (CANNULA) ×1
CANNULA REDUCER 12-8 DVNC XI (CANNULA) ×1 IMPLANT
CHLORAPREP W/TINT 26 (MISCELLANEOUS) ×3 IMPLANT
CLIP VESOLOCK MED LG 6/CT (CLIP) ×3 IMPLANT
COVER WAND RF STERILE (DRAPES) ×3 IMPLANT
CUP MEDICINE 2OZ PLAST GRAD ST (MISCELLANEOUS) ×3 IMPLANT
DECANTER SPIKE VIAL GLASS SM (MISCELLANEOUS) ×3 IMPLANT
DEFOGGER SCOPE WARMER CLEARIFY (MISCELLANEOUS) ×3 IMPLANT
DERMABOND ADVANCED (GAUZE/BANDAGES/DRESSINGS) ×2
DERMABOND ADVANCED .7 DNX12 (GAUZE/BANDAGES/DRESSINGS) ×1 IMPLANT
DRAPE ARM DVNC X/XI (DISPOSABLE) ×4 IMPLANT
DRAPE COLUMN DVNC XI (DISPOSABLE) ×1 IMPLANT
DRAPE DA VINCI XI ARM (DISPOSABLE) ×8
DRAPE DA VINCI XI COLUMN (DISPOSABLE) ×2
ELECT CAUTERY BLADE TIP 2.5 (TIP) ×3
ELECT REM PT RETURN 9FT ADLT (ELECTROSURGICAL) ×3
ELECTRODE CAUTERY BLDE TIP 2.5 (TIP) ×1 IMPLANT
ELECTRODE REM PT RTRN 9FT ADLT (ELECTROSURGICAL) ×1 IMPLANT
GLOVE SURG SYN 7.0 (GLOVE) ×6 IMPLANT
GLOVE SURG SYN 7.5  E (GLOVE) ×4
GLOVE SURG SYN 7.5 E (GLOVE) ×2 IMPLANT
GOWN STRL REUS W/ TWL LRG LVL3 (GOWN DISPOSABLE) ×4 IMPLANT
GOWN STRL REUS W/TWL LRG LVL3 (GOWN DISPOSABLE) ×8
IRRIGATOR SUCT 8 DISP DVNC XI (IRRIGATION / IRRIGATOR) ×1 IMPLANT
IRRIGATOR SUCTION 8MM XI DISP (IRRIGATION / IRRIGATOR) ×2
IV NS 1000ML (IV SOLUTION)
IV NS 1000ML BAXH (IV SOLUTION) IMPLANT
KIT PINK PAD W/HEAD ARE REST (MISCELLANEOUS) ×3
KIT PINK PAD W/HEAD ARM REST (MISCELLANEOUS) ×1 IMPLANT
LABEL OR SOLS (LABEL) ×3 IMPLANT
MANIFOLD NEPTUNE II (INSTRUMENTS) ×3 IMPLANT
NEEDLE HYPO 22GX1.5 SAFETY (NEEDLE) ×3 IMPLANT
NS IRRIG 500ML POUR BTL (IV SOLUTION) ×3 IMPLANT
OBTURATOR OPTICAL STANDARD 8MM (TROCAR) ×2
OBTURATOR OPTICAL STND 8 DVNC (TROCAR) ×1
OBTURATOR OPTICALSTD 8 DVNC (TROCAR) ×1 IMPLANT
PACK LAP CHOLECYSTECTOMY (MISCELLANEOUS) ×3 IMPLANT
PENCIL ELECTRO HAND CTR (MISCELLANEOUS) ×3 IMPLANT
POUCH SPECIMEN RETRIEVAL 10MM (ENDOMECHANICALS) ×3 IMPLANT
SEAL CANN UNIV 5-8 DVNC XI (MISCELLANEOUS) ×4 IMPLANT
SEAL XI 5MM-8MM UNIVERSAL (MISCELLANEOUS) ×8
SET TUBE SMOKE EVAC HIGH FLOW (TUBING) ×3 IMPLANT
SOLUTION ELECTROLUBE (MISCELLANEOUS) ×3 IMPLANT
SPONGE LAP 18X18 RF (DISPOSABLE) IMPLANT
SPONGE LAP 4X18 RFD (DISPOSABLE) ×3 IMPLANT
STAPLER CANNULA SEAL DVNC XI (STAPLE) ×1 IMPLANT
STAPLER CANNULA SEAL XI (STAPLE) ×2
SUT MNCRL AB 4-0 PS2 18 (SUTURE) ×3 IMPLANT
SUT VICRYL 0 AB UR-6 (SUTURE) ×6 IMPLANT
TAPE TRANSPORE STRL 2 31045 (GAUZE/BANDAGES/DRESSINGS) IMPLANT
TROCAR BALLN GELPORT 12X130M (ENDOMECHANICALS) ×3 IMPLANT

## 2020-08-18 NOTE — Progress Notes (Signed)
Received report from Cooper, RN at 2010. Initial assessment done. Pt was in restroom voiding. Vitals taken. Pt's mother at bedside. Pt denied any pain. AVS explained and provided to patient. No questions from pt and verbalized understanding of instructions. Safety measures in place. Pt leaving the unit via wheelchair with all her belongings accompanied by transporter and mother on RA.

## 2020-08-18 NOTE — Discharge Summary (Signed)
Patient ID: Katelyn Hall MRN: [id number] DOB/AGE: [birth year] 24 y.o.  Admit date: 08/17/2020 Discharge date: 08/18/2020   Discharge Diagnoses:  Active Problems:   RUQ pain   Biliary dyskinesia   Cholecystitis, acute   Procedures:  Robotic assisted cholecystectomy with ICG cholangiogram  Hospital Course:  Patient was admitted on 08/17/20 with abdominal pain.  Her U/S and CT scan did not specifically showed cholecystitis, but she did have a Murphy's sign on exam.  She was admitted and started on IV antibiotics and had a HIDA scan today 12/2, which showed a patent cystic duct, but biliary dyskinesia with EF of 3%.  The patient was taken to the OR and underwent a robotic cholecystectomy.  Her gallbladder was edematous.  She tolerated the surgery well.  Post-operatively, her diet was advanced, her pain was well controlled, and she was deemed ready for discharge home.  She will have a course of antibiotics given the gallbladder inflammation.   Follow up in 2 weeks.  Consults: None  Disposition:   Home, self-care  Discharge Instructions    Call MD for:  difficulty breathing, headache or visual disturbances   Complete by: As directed    Call MD for:  persistant nausea and vomiting   Complete by: As directed    Call MD for:  redness, tenderness, or signs of infection (pain, swelling, redness, odor or green/yellow discharge around incision site)   Complete by: As directed    Call MD for:  severe uncontrolled pain   Complete by: As directed    Call MD for:  temperature >100.4   Complete by: As directed    Diet - low sodium heart healthy   Complete by: As directed    Discharge instructions   Complete by: As directed    1.  Patient may shower, but do not scrub wounds heavily and dab dry only. 2.  Do not submerge wounds in pool/tub for 1 week. 3.  Do not apply ointments or hydrogen peroxide to the wounds. 4.  May apply ice packs to the wounds for comfort.   Driving Restrictions   Complete  by: As directed    Do not drive while taking narcotics for pain control.   Increase activity slowly   Complete by: As directed    Lifting restrictions   Complete by: As directed    No heavy lifting or pushing of more than 10-15 lbs for 4 weeks.   No dressing needed   Complete by: As directed      Allergies as of 08/18/2020   No Known Allergies     Medication List    TAKE these medications   amoxicillin-clavulanate 875-125 MG tablet Commonly known as: Augmentin Take 1 tablet by mouth 2 (two) times daily for 10 days.   ibuprofen 600 MG tablet Commonly known as: ADVIL Take 1 tablet (600 mg total) by mouth every 8 (eight) hours as needed for mild pain or moderate pain.   norgestimate-ethinyl estradiol 0.25-35 MG-MCG tablet Commonly known as: Sprintec 28 Take 1 tablet by mouth daily.   oxyCODONE 5 MG immediate release tablet Commonly known as: Oxy IR/ROXICODONE Take 1 tablet (5 mg total) by mouth every 4 (four) hours as needed for severe pain.            Discharge Care Instructions  (From admission, onward)         Start     Ordered   08/18/20 0000  No dressing needed  08/18/20 1717          Follow-up Information    Donovan Kail, PA-C Follow up in 2 week(s).   Specialty: Physician Assistant Contact information: [address] [place] [address] [phone]

## 2020-08-18 NOTE — Anesthesia Procedure Notes (Signed)
Procedure Name: Intubation Date/Time: 08/18/2020 3:25 PM Performed by: Jonna Clark, CRNA Pre-anesthesia Checklist: Patient identified, Patient being monitored, Timeout performed, Emergency Drugs available and Suction available Patient Re-evaluated:Patient Re-evaluated prior to induction Oxygen Delivery Method: Circle system utilized Preoxygenation: Pre-oxygenation with 100% oxygen Induction Type: IV induction Ventilation: Mask ventilation without difficulty Laryngoscope Size: Mac and 4 Grade View: Grade I Tube type: Oral Tube size: 7.0 mm Number of attempts: 1 Airway Equipment and Method: Stylet Placement Confirmation: ETT inserted through vocal cords under direct vision,  positive ETCO2 and breath sounds checked- equal and bilateral Secured at: 21 cm Tube secured with: Tape Dental Injury: Teeth and Oropharynx as per pre-operative assessment

## 2020-08-18 NOTE — Transfer of Care (Signed)
Immediate Anesthesia Transfer of Care Note  Patient: Katelyn Hall  Procedure(s) Performed: XI ROBOTIC ASSISTED LAPAROSCOPIC CHOLECYSTECTOMY; ICG (N/A )  Patient Location: PACU  Anesthesia Type:General  Level of Consciousness: drowsy and patient cooperative  Airway & Oxygen Therapy: Patient Spontanous Breathing and Patient connected to face mask oxygen  Post-op Assessment: Report given to RN and Post -op Vital signs reviewed and stable  Post vital signs: Reviewed and stable  Last Vitals:  Vitals Value Taken Time  BP 143/75 08/18/20 1725  Temp 36.2 C 08/18/20 1719  Pulse 80 08/18/20 1725  Resp 22 08/18/20 1725  SpO2 100 % 08/18/20 1725  Vitals shown include unvalidated device data.  Last Pain:  Vitals:   08/18/20 1721  TempSrc:   PainSc: Asleep         Complications: No complications documented.

## 2020-08-18 NOTE — Progress Notes (Signed)
08/18/2020  Subjective: No acute events.  Patient reports feeling a bit better with the pain medications.  Denies any nausea.    Vital signs: Temp:  [97.8 F (36.6 C)-98.8 F (37.1 C)] 97.8 F (36.6 C) (12/02 1404) Pulse Rate:  [52-86] 81 (12/02 1404) Resp:  [16-18] 16 (12/02 1404) BP: (99-121)/(58-86) 113/73 (12/02 1404) SpO2:  [98 %-100 %] 100 % (12/02 1404)   Intake/Output: 12/01 0701 - 12/02 0700 In: -  Out: 200 [Urine:200] Last BM Date: 08/16/20  Physical Exam: Constitutional:  No acute distress Abdomen:  Soft, non-distended, with tenderness in RUQ.  Labs:  Recent Labs    08/17/20 1030 08/18/20 0541  WBC 17.6* 10.7*  HGB 12.4 11.4*  HCT 36.9 34.1*  PLT 266 219   Recent Labs    08/17/20 1030 08/18/20 0541  NA 135 137  K 3.6 3.9  CL 101 108  CO2 22 24  GLUCOSE 170* 113*  BUN 11 5*  CREATININE 0.77 0.73  CALCIUM 9.1 8.3*   No results for input(s): LABPROT, INR in the last 72 hours.  Imaging: NM Hepato W/EF  Result Date: 08/18/2020 CLINICAL DATA:  RIGHT upper quadrant abdominal pain EXAM: NUCLEAR MEDICINE HEPATOBILIARY IMAGING WITH GALLBLADDER EF TECHNIQUE: Sequential images of the abdomen were obtained out to 60 minutes following intravenous administration of radiopharmaceutical. After oral ingestion of Ensure, gallbladder ejection fraction was determined. At 60 min, normal ejection fraction is greater than 33%. RADIOPHARMACEUTICALS:  5.1 mCi Tc-79m  Choletec IV COMPARISON:  CT abdomen and pelvis 08/17/2020, ultrasound abdomen 08/17/2020 FINDINGS: Normal tracer extraction from bloodstream indicating normal hepatocellular function. Normal excretion of tracer into biliary tree. Gallbladder visualized at 32 min. Small bowel visualized at 19 min. No hepatic retention of tracer. Subjectively markedly decreased emptying of tracer from gallbladder following fatty meal stimulation. Calculated gallbladder ejection fraction is 3%, abnormal. Patient reported no symptoms  following Ensure ingestion. Normal gallbladder ejection fraction following Ensure ingestion is greater than 33% at 1 hour. IMPRESSION: Patent biliary tree. Abnormal gallbladder response to fatty meal stimulation with only 3% gallbladder emptying following Ensure. Electronically Signed   By: Ulyses Southward M.D.   On: 08/18/2020 11:55    Assessment/Plan: This is a 24 y.o. female with biliary dyskinesia  --Patient had HIDA scan this morning, which shows a patent cystic duct, but her EF is 3%. --Discussed with her that given her symptoms, I would recommend proceeding with robotic cholecystectomy with ICG cholangiogram.  Discussed with her the risks of bleeding, infection, injury to surrounding structures, and she's willing to proceed. --If she's doing well post-op tonight, may be able to d/c her home.   Howie Ill, MD [facility]

## 2020-08-18 NOTE — Progress Notes (Signed)
Arrived to room at 0902.  Pt not in room.  Securechat sent to [facility] RN s/t unable to locate on the unit to re-enter the IV Team consult if PIV still needed once returns to room.

## 2020-08-18 NOTE — Op Note (Signed)
°  Procedure Date:  08/18/2020  Pre-operative Diagnosis:  Biliary dyskinesia  Post-operative Diagnosis:  Acute cholecystitis  Procedure:  Robotic assisted cholecystectomy with ICG FireFly cholangiogram  Surgeon:  Howie Ill, MD  Anesthesia:  General endotracheal  Estimated Blood Loss:  10 ml  Specimens:  gallbladder  Complications:  None  Indications for Procedure:  This is a 24 y.o. female who presents with abdominal pain and workup revealing biliary dyskinesia on HIDA scan done today.  The benefits, complications, treatment options, and expected outcomes were discussed with the patient. The risks of bleeding, infection, recurrence of symptoms, failure to resolve symptoms, bile duct damage, bile duct leak, retained common bile duct stone, bowel injury, and need for further procedures were all discussed with the patient and she was willing to proceed.  Description of Procedure: The patient was correctly identified in the preoperative area and brought into the operating room.  The patient was placed supine with VTE prophylaxis in place.  Appropriate time-outs were performed.  Anesthesia was induced and the patient was intubated.  Appropriate antibiotics were infused.  The abdomen was prepped and draped in a sterile fashion. An infraumbilical incision was made. A cutdown technique was used to enter the abdominal cavity without injury, and a 12 mm robotic port was inserted.  Pneumoperitoneum was obtained with appropriate opening pressures.  Three 8-mm ports were placed in the mid abdomen at the level of the umbilicus under direct visualization.  The DaVinci platform was docked, camera targeted, and instruments were placed under direct visualization.  The gallbladder was identified.  It was noted to be very edematous.  The fundus was grasped and retracted cephalad.  Adhesions were lysed bluntly and with electrocautery. The infundibulum was grasped and retracted laterally, exposing the  peritoneum overlying the gallbladder.  This was incised with electrocautery and extended on either side of the gallbladder.  FireFly cholangiogram was then obtained, and we were able to clearly identify the cystic duct and common bile duct.  The cystic duct and cystic artery were carefully dissected with combination of cautery and blunt dissection.  Both were clipped twice proximally and once distally, cutting in between.  The gallbladder was taken from the gallbladder fossa in a retrograde fashion with electrocautery. There was bile spillage during this process.  The gallbladder was placed in an Endocatch bag. The liver bed was inspected and any bleeding was controlled with electrocautery. The right upper quadrant was then inspected again revealing intact clips, no bleeding, and no ductal injury.  The area was thoroughly irrigated.  The 8 mm ports were removed under direct visualization and the 12 mm port was removed.  The Endocatch bag was brought out via the umbilical incision. The fascial opening was closed using 0 vicryl suture.  Local anesthetic was infused in all incisions.  The umbilical incision was closed using 3-0 Vicryl and 4-0 Monocryl.  The other incisions were closed with 4-0 Monocryl.  The wounds were cleaned and sealed with DermaBond.  The patient was emerged from anesthesia and extubated and brought to the recovery room for further management.  The patient tolerated the procedure well and all counts were correct at the end of the case.   Howie Ill, MD

## 2020-08-18 NOTE — Anesthesia Preprocedure Evaluation (Signed)
Anesthesia Evaluation  Patient identified by MRN, date of birth, ID band Patient awake    Reviewed: Allergy & Precautions, NPO status , Patient's Chart, lab work & pertinent test results  Airway Mallampati: II       Dental  (+) Chipped   Pulmonary neg pulmonary ROS,    Pulmonary exam normal breath sounds clear to auscultation       Cardiovascular negative cardio ROS Normal cardiovascular exam Rhythm:Regular Rate:Normal     Neuro/Psych negative neurological ROS  negative psych ROS   GI/Hepatic Neg liver ROS, GERD  ,  Endo/Other  negative endocrine ROS  Renal/GU negative Renal ROS  negative genitourinary   Musculoskeletal negative musculoskeletal ROS (+)   Abdominal   Peds negative pediatric ROS (+)  Hematology negative hematology ROS (+)   Anesthesia Other Findings   Reproductive/Obstetrics negative OB ROS                             Anesthesia Physical Anesthesia Plan  ASA: II  Anesthesia Plan: General   Post-op Pain Management:    Induction: Intravenous  PONV Risk Score and Plan: 3 and Ondansetron and Dexamethasone  Airway Management Planned: Oral ETT  Additional Equipment:   Intra-op Plan:   Post-operative Plan: Extubation in OR  Informed Consent: I have reviewed the patients History and Physical, chart, labs and discussed the procedure including the risks, benefits and alternatives for the proposed anesthesia with the patient or authorized representative who has indicated his/her understanding and acceptance.     Dental advisory given  Plan Discussed with: CRNA, Anesthesiologist and Surgeon  Anesthesia Plan Comments:         Anesthesia Quick Evaluation

## 2020-08-19 NOTE — Anesthesia Postprocedure Evaluation (Signed)
Anesthesia Post Note  Patient: Katelyn Hall  Procedure(s) Performed: XI ROBOTIC ASSISTED LAPAROSCOPIC CHOLECYSTECTOMY; ICG (N/A )  Patient location during evaluation: PACU Anesthesia Type: General Level of consciousness: awake Pain management: pain level controlled Vital Signs Assessment: post-procedure vital signs reviewed and stable Respiratory status: spontaneous breathing Cardiovascular status: blood pressure returned to baseline and stable Postop Assessment: no apparent nausea or vomiting Anesthetic complications: no   No complications documented.   Last Vitals:  Vitals:   08/18/20 1756 08/18/20 2020  BP:  126/78  Pulse: 62 91  Resp: 11 16  Temp:  36.8 C  SpO2: 100% 100%    Last Pain:  Vitals:   08/18/20 2020  TempSrc: Oral  PainSc:                  Emilio Math

## 2020-08-22 LAB — SURGICAL PATHOLOGY

## 2020-08-22 NOTE — Progress Notes (Signed)
08/22/20  Pathology results reviewed.  Acute cholecystitis and cholelithiasis.  Results released to patient.  Henrene Dodge, MD

## 2020-08-31 ENCOUNTER — Telehealth: Payer: Self-pay | Admitting: *Deleted

## 2020-08-31 NOTE — Telephone Encounter (Signed)
Faxed FMLA to [employer] at [phone] 

## 2020-09-05 ENCOUNTER — Encounter: Payer: Self-pay | Admitting: Physician Assistant

## 2020-09-05 ENCOUNTER — Other Ambulatory Visit: Payer: Self-pay

## 2020-09-05 ENCOUNTER — Ambulatory Visit (INDEPENDENT_AMBULATORY_CARE_PROVIDER_SITE_OTHER): Payer: BC Managed Care – PPO | Admitting: Physician Assistant

## 2020-09-05 VITALS — BP 136/86 | HR 99 | Temp 98.6°F | Ht 64.0 in | Wt 178.2 lb

## 2020-09-05 DIAGNOSIS — Z09 Encounter for follow-up examination after completed treatment for conditions other than malignant neoplasm: Secondary | ICD-10-CM

## 2020-09-05 DIAGNOSIS — K810 Acute cholecystitis: Secondary | ICD-10-CM

## 2020-09-05 NOTE — Progress Notes (Signed)
[facility] POST-OP OFFICE VISIT  09/05/2020  HPI: Katelyn Hall is a 24 y.o. female 18 days s/p Robotic assisted cholecystectomy with ICG FireFly cholangiogram for acute cholecystitis  She is doing much better Still with abdominal soreness, mostly at the umbilical incisions. This is very mild and intermittent in nature. Not needing any pain medications No fever, chills, nausea, emesis She did have diarrhea the first few days but this is resolved No issues with incisions No trouble with PO intake  Vital signs: BP 136/86    Pulse 99    Temp 98.6 F (37 C) (Oral)    Ht 5\' 4"  (1.626 m)    Wt 178 lb 3.2 oz (80.8 kg)    SpO2 96%    BMI 30.59 kg/m    Physical Exam: Constitutional: Well appearing female, NAD Abdomen: Soft, non-tender, non-distended, no rebound/guarding Skin: Laparoscopic incisions are well healed, no erythema or drainage  Assessment/Plan: This is a 24 y.o. female 18 days s/p Robotic assisted cholecystectomy with ICG FireFly cholangiogram for acute cholecystitis   - Pain control prn  - Reviewed lifting restrictions  - Reviewed pathology: Acute cholecystitis, cholelithiasis  - rtc on as needed basis  -- 22, PA-C [facility] 09/05/2020, 11:54 AM [phone] M-F: 7am - 4pm

## 2020-09-05 NOTE — Patient Instructions (Signed)
Stay away from fatty foods to avoid diarrhea. If you have any concerns or questions, please feel free to call our clinic.    GENERAL POST-OPERATIVE PATIENT INSTRUCTIONS   WOUND CARE INSTRUCTIONS:  Keep a dry clean dressing on the wound if there is drainage. The initial bandage may be removed after 24 hours.  Once the wound has quit draining you may leave it open to air.  If clothing rubs against the wound or causes irritation and the wound is not draining you may cover it with a dry dressing during the daytime.  Try to keep the wound dry and avoid ointments on the wound unless directed to do so.  If the wound becomes bright red and painful or starts to drain infected material that is not clear, please contact your physician immediately.  If the wound is mildly pink and has a thick firm ridge underneath it, this is normal, and is referred to as a healing ridge.  This will resolve over the next 4-6 weeks.  BATHING: You may shower if you have been informed of this by your surgeon. However, Please do not submerge in a tub, hot tub, or pool until incisions are completely sealed or have been told by your surgeon that you may do so.  DIET:  You may eat any foods that you can tolerate.  It is a good idea to eat a high fiber diet and take in plenty of fluids to prevent constipation.  If you do become constipated you may want to take a mild laxative or take ducolax tablets on a daily basis until your bowel habits are regular.  Constipation can be very uncomfortable, along with straining, after recent surgery.  ACTIVITY:  You are encouraged to cough and deep breath or use your incentive spirometer if you were given one, every 15-30 minutes when awake.  This will help prevent respiratory complications and low grade fevers post-operatively if you had a general anesthetic.  You may want to hug a pillow when coughing and sneezing to add additional support to the surgical area, if you had abdominal or chest surgery,  which will decrease pain during these times.  You are encouraged to walk and engage in light activity for the next two weeks.  You should not lift more than 15 pounds, until 09/19/2020 as it could put you at increased risk for complications.  Twenty pounds is roughly equivalent to a plastic bag of groceries. At that time- Listen to your body when lifting, if you have pain when lifting, stop and then try again in a few days. Soreness after doing exercises or activities of daily living is normal as you get back in to your normal routine.  MEDICATIONS:  Try to take narcotic medications and anti-inflammatory medications, such as tylenol, ibuprofen, naprosyn, etc., with food.  This will minimize stomach upset from the medication.  Should you develop nausea and vomiting from the pain medication, or develop a rash, please discontinue the medication and contact your physician.  You should not drive, make important decisions, or operate machinery when taking narcotic pain medication.  SUNBLOCK Use sun block to incision area over the next year if this area will be exposed to sun. This helps decrease scarring and will allow you avoid a permanent darkened area over your incision.  QUESTIONS:  Please feel free to call our office if you have any questions, and we will be glad to assist you. [phone]

## 2021-02-06 ENCOUNTER — Encounter: Payer: BC Managed Care – PPO | Admitting: Certified Nurse Midwife

## 2021-03-03 ENCOUNTER — Encounter: Payer: BC Managed Care – PPO | Admitting: Certified Nurse Midwife

## 2021-03-10 ENCOUNTER — Other Ambulatory Visit: Payer: Self-pay

## 2021-03-10 ENCOUNTER — Other Ambulatory Visit (HOSPITAL_COMMUNITY)
Admission: RE | Admit: 2021-03-10 | Discharge: 2021-03-10 | Disposition: A | Discharge status: Home / Self Care | Payer: BC Managed Care – PPO | Source: Ambulatory Visit | Attending: Certified Nurse Midwife | Admitting: Certified Nurse Midwife

## 2021-03-10 ENCOUNTER — Encounter: Payer: Self-pay | Admitting: Certified Nurse Midwife

## 2021-03-10 ENCOUNTER — Ambulatory Visit (INDEPENDENT_AMBULATORY_CARE_PROVIDER_SITE_OTHER): Payer: BC Managed Care – PPO | Admitting: Certified Nurse Midwife

## 2021-03-10 VITALS — BP 110/77 | HR 97 | Resp 16 | Ht 64.0 in | Wt 185.6 lb

## 2021-03-10 DIAGNOSIS — Z8742 Personal history of other diseases of the female genital tract: Secondary | ICD-10-CM | POA: Insufficient documentation

## 2021-03-10 DIAGNOSIS — Z01419 Encounter for gynecological examination (general) (routine) without abnormal findings: Secondary | ICD-10-CM

## 2021-03-10 DIAGNOSIS — Z23 Encounter for immunization: Secondary | ICD-10-CM

## 2021-03-10 DIAGNOSIS — Z3041 Encounter for surveillance of contraceptive pills: Secondary | ICD-10-CM

## 2021-03-10 DIAGNOSIS — Z124 Encounter for screening for malignant neoplasm of cervix: Secondary | ICD-10-CM | POA: Diagnosis not present

## 2021-03-10 DIAGNOSIS — Z111 Encounter for screening for respiratory tuberculosis: Secondary | ICD-10-CM

## 2021-03-10 MED ORDER — NORGESTIMATE-ETH ESTRADIOL 0.25-35 MG-MCG PO TABS: 1.0000 | ORAL_TABLET | Freq: Every day | ORAL | 3 refills | Status: AC

## 2021-03-10 NOTE — Progress Notes (Signed)
ANNUAL PREVENTATIVE CARE GYN  ENCOUNTER NOTE  Subjective:       Katelyn Hall is a 25 y.o. G0P0000 female here for a routine annual gynecologic exam.  Current complaints: 1. Requests OCP refill 2. 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 Requires TDaP and Tb testing.   Denies difficulty breathing or respiratory distress, chest pain, abdominal pain, excessive vaginal bleeding, dysuria, and leg pain or swelling.    Gynecologic History  Patient's last menstrual period was 03/10/2021 (exact date). Period Cycle (Days): 28 Period Duration (Days): 3 Period Pattern: Regular Menstrual Flow: Light Menstrual Control: Tampon Menstrual Control Change Freq (Hours): 6 Dysmenorrhea: None  Contraception: OCP (estrogen/progesterone)  Last Pap: 11/2019. Results were: normal; history of abnormal  Obstetric History  OB History  Gravida Para Term Preterm AB Living  0 0 0 0 0 0  SAB IAB Ectopic Multiple Live Births  0 0 0 0    Obstetric Comments  1st Menstrual Cycle:  12  1st Pregnancy:  NA    Past Medical History:  Diagnosis Date   Abnormal Pap smear of cervix 06/2017   Allergy    FH: colon cancer in relative <68 years old 12/09/2019   GERD (gastroesophageal reflux disease)    OCC-TUMS PRN   History of chlamydia 2018   Found by PAP smear and Rx for azithromycin and diflucan   Obesity (BMI 30.0-34.9)     Past Surgical History:  Procedure Laterality Date   SIGMOIDOSCOPY N/A 06/13/2017   Procedure: SIGMOIDOSCOPY;  Surgeon: Kieth Brightly, MD;  Location: [facility] ORS;  Service: General;  Laterality: N/A;   SPHINCTEROTOMY N/A 06/13/2017   Procedure: LATERAL INTERNAL SPHINCTEROTOMY;  Surgeon: Kieth Brightly, MD;  Location: [facility] ORS;  Service: General;  Laterality: N/A;    Current Outpatient Medications on File Prior to Visit  Medication Sig Dispense Refill   norgestimate-ethinyl estradiol (SPRINTEC 28) 0.25-35 MG-MCG tablet Take 1 tablet by mouth  daily. 3 Package 4   No current facility-administered medications on file prior to visit.    No Known Allergies  Social History   Socioeconomic History   Marital status: Single    Spouse name: Not on file   Number of children: Not on file   Years of education: Not on file   Highest education level: Not on file  Occupational History   Not on file  Tobacco Use   Smoking status: Never   Smokeless tobacco: Never  Vaping Use   Vaping Use: Never used  Substance and Sexual Activity   Alcohol use: Yes    Alcohol/week: 6.0 standard drinks    Types: 6 Cans of beer per week    Comment: weekends    Drug use: No   Sexual activity: Yes    Birth control/protection: Pill  Other Topics Concern   Not on file  Social History Narrative   Not on file   Social Determinants of Health   Financial Resource Strain: Not on file  Food Insecurity: Not on file  Transportation Needs: Not on file  Physical Activity: Not on file  Stress: Not on file  Social Connections: Not on file  Intimate Partner Violence: Not on file    Family History  Problem Relation Age of Onset   Colon cancer Father 43   Colon polyps Paternal Uncle    Cancer Paternal Grandmother     The following portions of the patient's history were reviewed and updated as appropriate: allergies, current medications, past family history,  past medical history, past social history, past surgical history and problem list.  Review of Systems  ROS negative except as noted above. Information obtained from patient.    Objective:   BP 110/77   Pulse 97   Resp 16   Ht 5\' 4"  (1.626 m)   Wt 185 lb 9.6 oz (84.2 kg)   LMP 03/10/2021 (Exact Date)   BMI 31.86 kg/m   CONSTITUTIONAL: Well-developed, well-nourished female in no acute distress.   PSYCHIATRIC: Normal mood and affect. Normal behavior. Normal judgment and thought content.  NEUROLGIC: Alert and oriented to person, place, and time. Normal muscle tone coordination. No  cranial nerve deficit noted.  HENT:  Normocephalic, atraumatic.  EYES: Conjunctivae and EOM are normal.   NECK: Normal range of motion, supple, no masses.  Normal thyroid.   SKIN: Skin is warm and dry. No rash noted. Not diaphoretic. No erythema. No pallor.  CARDIOVASCULAR: Normal heart rate noted, regular rhythm, no murmur.  RESPIRATORY: Clear to auscultation bilaterally. Effort and breath sounds normal, no problems with respiration noted.  BREASTS: Symmetric in size. No masses, skin changes, nipple drainage, or lymphadenopathy.  ABDOMEN: Soft, normal bowel sounds, no distention noted.  No tenderness, rebound or guarding.   PELVIC:  External Genitalia: Normal  Vagina: Normal  Cervix: Normal, Pap collected  Uterus: Normal  Adnexa: Normal  MUSCULOSKELETAL: Normal range of motion. No tenderness.  No cyanosis, clubbing, or edema.  2+ distal pulses.  LYMPHATIC: No Axillary, Supraclavicular, or Inguinal Adenopathy.  Assessment:   Annual gynecologic examination 25 y.o.  Contraception: OCP (estrogen/progesterone)  Obesity 1  Problem List Items Addressed This Visit       Other   History of abnormal cervical Pap smear   Other Visit Diagnoses     Well woman exam    -  Primary   Screening-pulmonary TB       Relevant Orders   QuantiFERON-TB Gold Plus   Need for Tdap vaccination       Relevant Orders   Tdap vaccine greater than or equal to 7yo IM (Completed)   Screening for cervical cancer       Surveillance for birth control, oral contraceptives           Plan:   Pap: Pap, Reflex if ASCUS  Labs: Not needed  Routine preventative health maintenance measures emphasized: Exercise/Diet/Weight control, Tobacco Warnings, Alcohol/Substance use risks, and Stress Management; see AVS  Rx OCP, see orders  Reviewed red flag symptoms and when to call  Return to Clinic - 1 Year for 22 or sooner if needed   [employer], CNM  [facility],  [facility] 03/10/21 3:37 PM

## 2021-03-10 NOTE — Patient Instructions (Signed)
Preventive Care 21-26 Years Old, Female Preventive care refers to lifestyle choices and visits with your health care provider that can promote health and wellness. This includes: A yearly physical exam. This is also called an annual wellness visit. Regular dental and eye exams. Immunizations. Screening for certain conditions. Healthy lifestyle choices, such as: Eating a healthy diet. Getting regular exercise. Not using drugs or products that contain nicotine and tobacco. Limiting alcohol use. What can I expect for my preventive care visit? Physical exam Your health care provider may check your: Height and weight. These may be used to calculate your BMI (body mass index). BMI is a measurement that tells if you are at a healthy weight. Heart rate and blood pressure. Body temperature. Skin for abnormal spots. Counseling Your health care provider may ask you questions about your: Past medical problems. Family's medical history. Alcohol, tobacco, and drug use. Emotional well-being. Home life and relationship well-being. Sexual activity. Diet, exercise, and sleep habits. Work and work environment. Access to firearms. Method of birth control. Menstrual cycle. Pregnancy history. What immunizations do I need?  Vaccines are usually given at various ages, according to a schedule. Your health care provider will recommend vaccines for you based on your age, medicalhistory, and lifestyle or other factors, such as travel or where you work. What tests do I need?  Blood tests Lipid and cholesterol levels. These may be checked every 5 years starting at age 20. Hepatitis C test. Hepatitis B test. Screening Diabetes screening. This is done by checking your blood sugar (glucose) after you have not eaten for a while (fasting). STD (sexually transmitted disease) testing, if you are at risk. BRCA-related cancer screening. This may be done if you have a family history of breast, ovarian, tubal, or  peritoneal cancers. Pelvic exam and Pap test. This may be done every 3 years starting at age 21. Starting at age 30, this may be done every 5 years if you have a Pap test in combination with an HPV test. Talk with your health care provider about your test results, treatment options,and if necessary, the need for more tests. Follow these instructions at home: Eating and drinking  Eat a healthy diet that includes fresh fruits and vegetables, whole grains, lean protein, and low-fat dairy products. Take vitamin and mineral supplements as recommended by your health care provider. Do not drink alcohol if: Your health care provider tells you not to drink. You are pregnant, may be pregnant, or are planning to become pregnant. If you drink alcohol: Limit how much you have to 0-1 drink a day. Be aware of how much alcohol is in your drink. In the [place] one drink equals one 12 oz bottle of beer (355 mL), one 5 oz glass of wine (148 mL), or one 1 oz glass of hard liquor (44 mL).  Lifestyle Take daily care of your teeth and gums. Brush your teeth every morning and night with fluoride toothpaste. Floss one time each day. Stay active. Exercise for at least 30 minutes 5 or more days each week. Do not use any products that contain nicotine or tobacco, such as cigarettes, e-cigarettes, and chewing tobacco. If you need help quitting, ask your health care provider. Do not use drugs. If you are sexually active, practice safe sex. Use a condom or other form of protection to prevent STIs (sexually transmitted infections). If you do not wish to become pregnant, use a form of birth control. If you plan to become pregnant, see your health care   provider for a prepregnancy visit. Find healthy ways to cope with stress, such as: Meditation, yoga, or listening to music. Journaling. Talking to a trusted person. Spending time with friends and family. Safety Always wear your seat belt while driving or riding in a  vehicle. Do not drive: If you have been drinking alcohol. Do not ride with someone who has been drinking. When you are tired or distracted. While texting. Wear a helmet and other protective equipment during sports activities. If you have firearms in your house, make sure you follow all gun safety procedures. Seek help if you have been physically or sexually abused. What's next? Go to your health care provider once a year for an annual wellness visit. Ask your health care provider how often you should have your eyes and teeth checked. Stay up to date on all vaccines. This information is not intended to replace advice given to you by your health care provider. Make sure you discuss any questions you have with your healthcare provider. Document Revised: 05/01/2020 Document Reviewed: 05/15/2018 Elsevier Patient Education  2022 [employer].

## 2021-03-15 ENCOUNTER — Telehealth: Payer: Self-pay | Admitting: Certified Nurse Midwife

## 2021-03-15 LAB — CYTOLOGY - PAP
Diagnosis: NEGATIVE
Diagnosis: REACTIVE

## 2021-03-15 NOTE — Telephone Encounter (Signed)
Patient called asking if we can provide a form/notes stating that she had completed her annual physical this year and her immunization report. I have printed out her immunization but was unsure about the form. Please Advise.

## 2021-03-15 NOTE — Telephone Encounter (Signed)
Form, immunization report and release of medical records report at front desk for patient to pick up.

## 2021-05-17 DIAGNOSIS — N390 Urinary tract infection, site not specified: Secondary | ICD-10-CM | POA: Diagnosis not present

## 2021-08-16 IMAGING — US US ABDOMEN LIMITED
1 series · 14 of 25 positions shown · non-contrast
Comparison: None.

CLINICAL DATA: Right upper quadrant abdominal pain.

EXAM:
ULTRASOUND ABDOMEN LIMITED RIGHT UPPER QUADRANT

[Series 1: us abdomen limited ruq (liver/gb) · 14 of 57 slices shown]
[im 1/57]
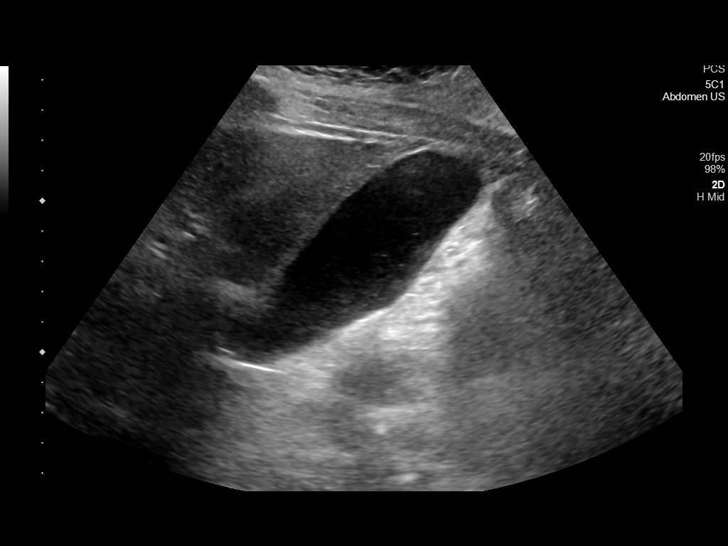
[im 5/57]
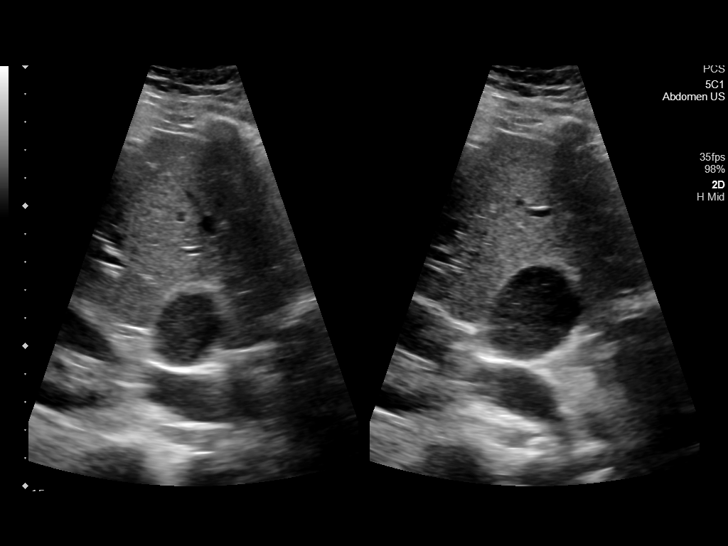
[im 10/57]
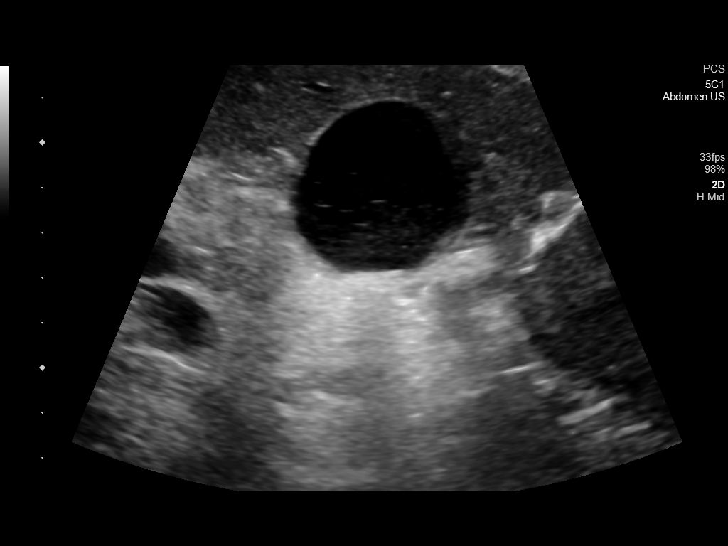
[im 15/57]
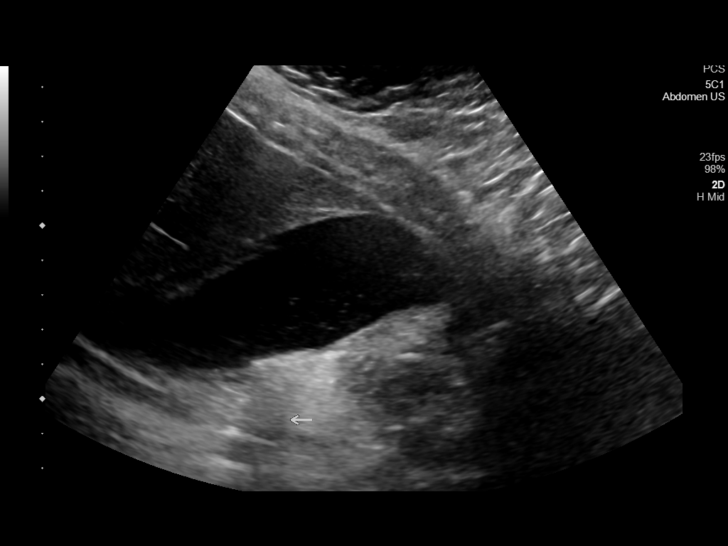
[im 19/57]
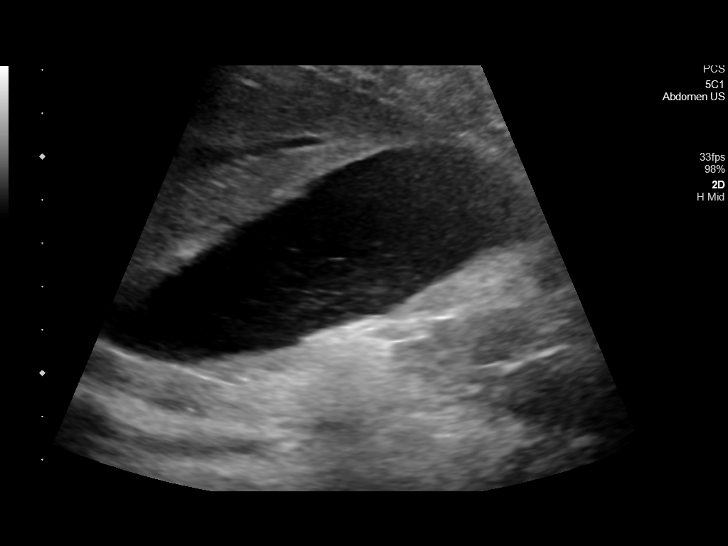
[im 22/57]
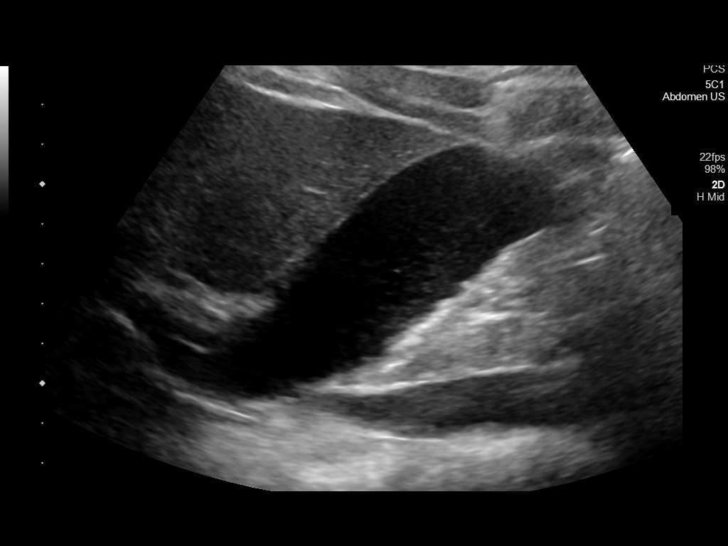
[im 26/57]
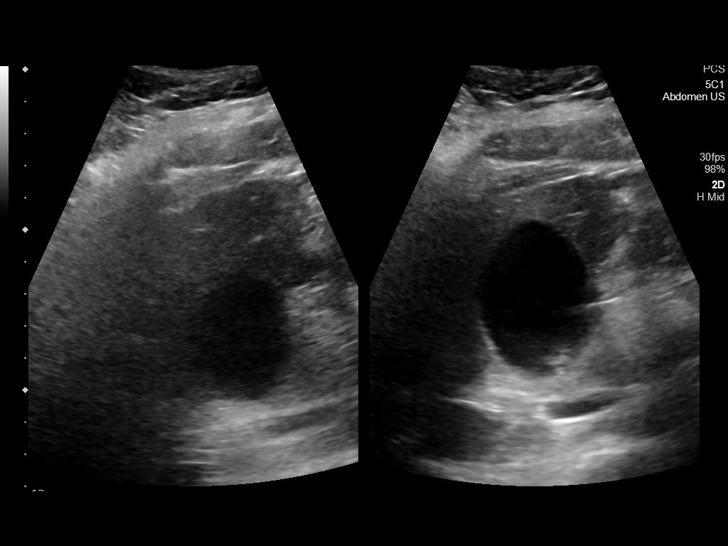
[im 31/57]
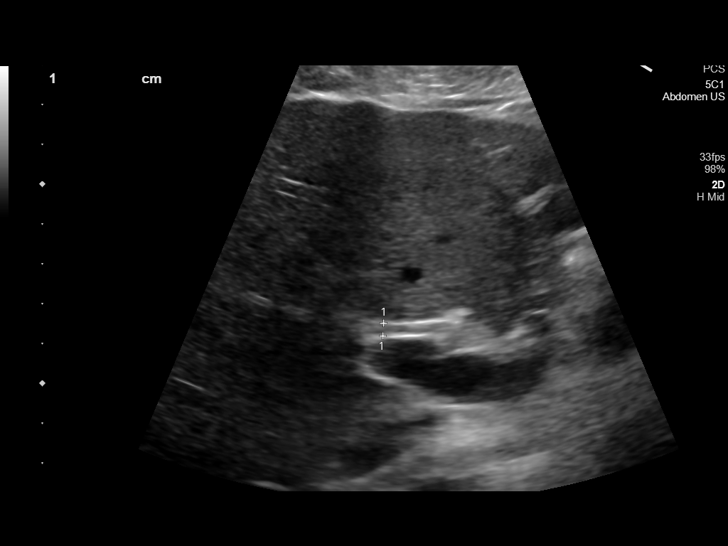
[im 36/57]
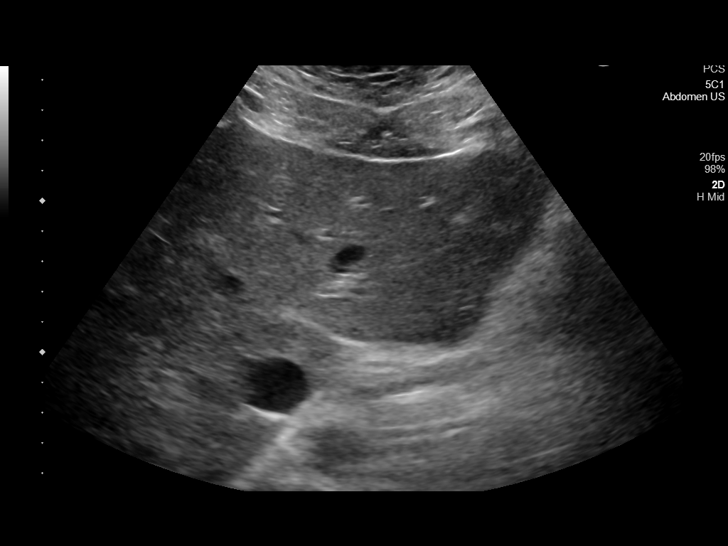
[im 38/57]
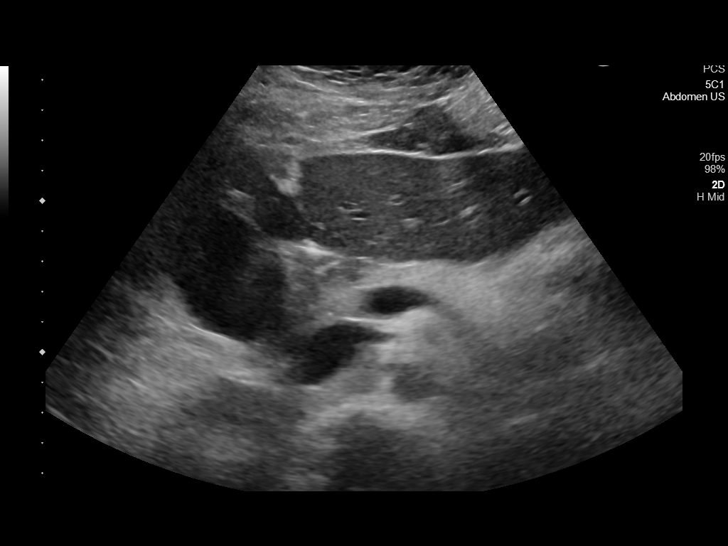
[im 43/57]
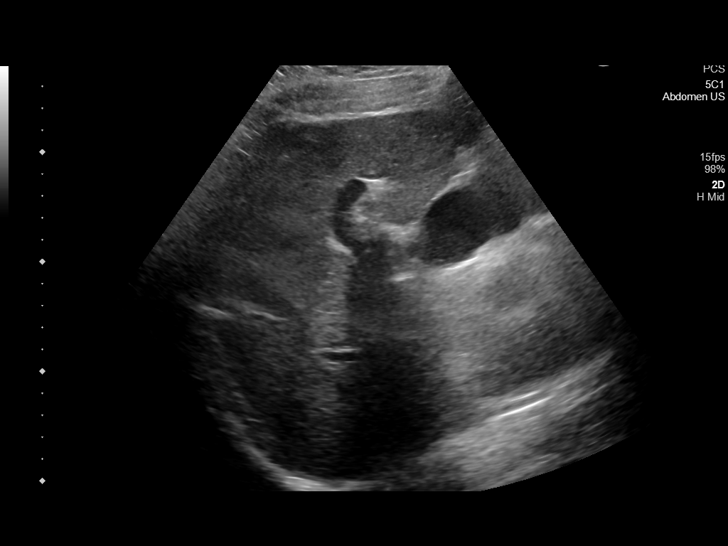
[im 47/57]
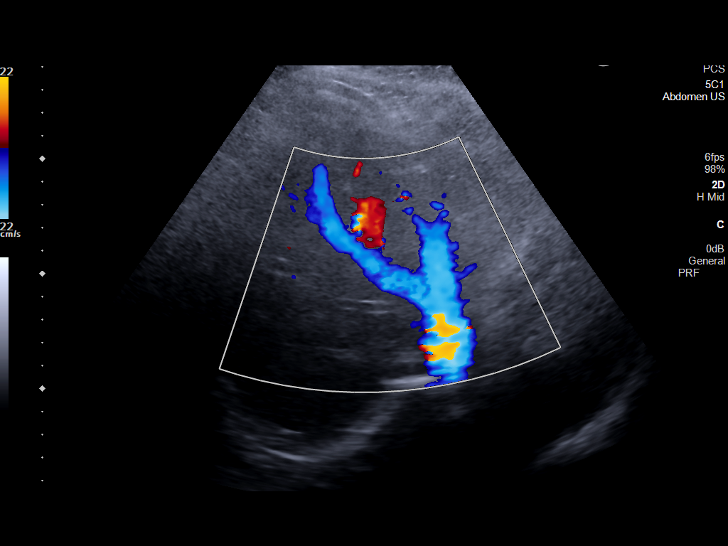
[im 52/57]
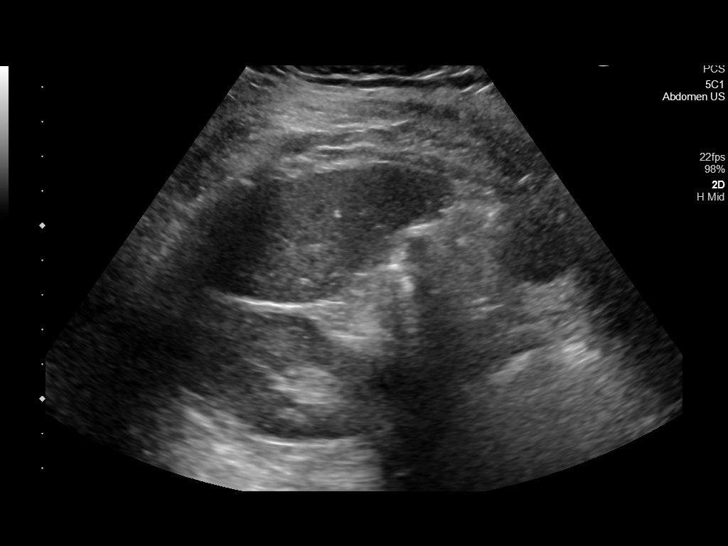
[im 57/57]
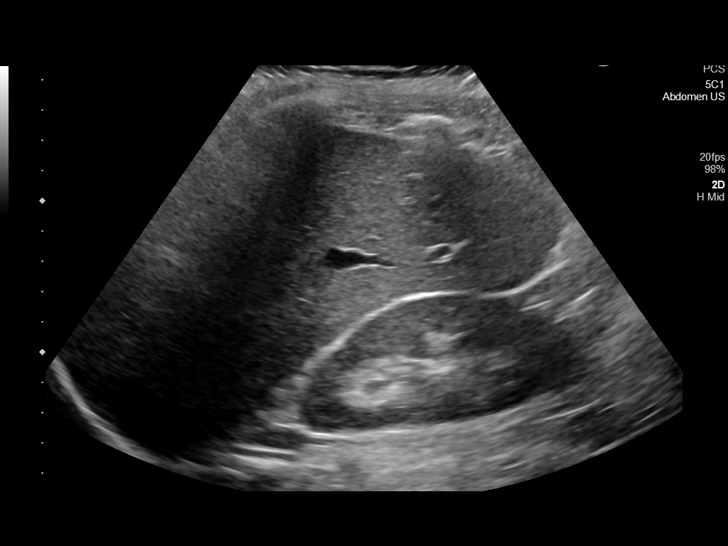

[14 of 25 positions shown; findings below may reference images not displayed]

FINDINGS: Gallbladder:

Intraluminal stones and sludge are present. The largest calculus
measures 8 mm. There is no wall thickening or pericholecystic free
fluid. No sonographic Murphy sign noted by sonographer.

Common bile duct:

Diameter: 3.2 mm

Liver:

No focal lesion identified. Within normal limits in parenchymal
echogenicity. Portal vein is patent on color Doppler imaging with
normal direction of blood flow towards the liver.

Other: None.
IMPRESSION: Cholelithiasis without evidence of cholecystitis.

## 2021-08-17 IMAGING — NM NM HEPATO W/GB/PHARM/[PERSON_NAME]
2 series · 12 of 12 positions shown · non-contrast
Comparison: CT abdomen and pelvis 08/17/2020, ultrasound abdomen
08/17/2020

CLINICAL DATA: RIGHT upper quadrant abdominal pain

EXAM:
NUCLEAR MEDICINE HEPATOBILIARY IMAGING WITH GALLBLADDER EF
TECHNIQUE: Sequential images of the abdomen were obtained [DATE] minutes
following intravenous administration of radiopharmaceutical. After
oral ingestion of Ensure, gallbladder ejection fraction was
determined. At 60 min, normal ejection fraction is greater than 33%.
RADIOPHARMACEUTICALS:  5.1 mCi [id number]  Choletec IV

[Series 1000: gallbladder ef · 4.80mm/px · 6 of 120 frames shown]
[frame 11/120]
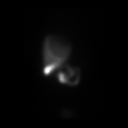
[frame 31/120]
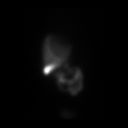
[frame 51/120]
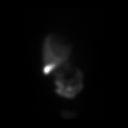
[frame 71/120]
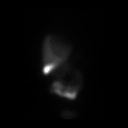
[frame 91/120]
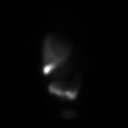
[frame 111/120]
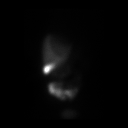

[Series 1000: hepatobiliary scan · 9.59mm/px · 6 of 60 frames shown]
[frame 6/60]
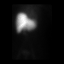
[frame 16/60]
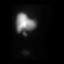
[frame 26/60]
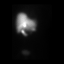
[frame 36/60]
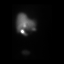
[frame 46/60]
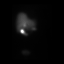
[frame 56/60]
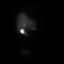

[12 of 12 positions shown; findings below may reference images not displayed]

FINDINGS: Normal tracer extraction from bloodstream indicating normal
hepatocellular function.

Normal excretion of tracer into biliary tree.

Gallbladder visualized at 32 min.

Small bowel visualized at 19 min.

No hepatic retention of tracer.

Subjectively markedly decreased emptying of tracer from gallbladder
following fatty meal stimulation.

Calculated gallbladder ejection fraction is 3%, abnormal.

Patient reported no symptoms following Ensure ingestion.

Normal gallbladder ejection fraction following Ensure ingestion is
greater than 33% at 1 hour.
IMPRESSION: Patent biliary tree.

Abnormal gallbladder response to fatty meal stimulation with only 3%
gallbladder emptying following Ensure.

## 2022-02-21 ENCOUNTER — Telehealth: Payer: Self-pay | Admitting: Obstetrics and Gynecology

## 2022-02-21 NOTE — Telephone Encounter (Signed)
Left message for pt making her aware that we received a refill request from [facility] pharmacy for norg-ethin estra, pt has not been seen since 6- 2022. Asked pt to call office to get scheduled for physical with a new provider.
